# Patient Record
Sex: Female | Born: 1950
Health system: Southern US, Community
[De-identification: ages and names within clinical notes are randomized; demographics above are authoritative.]

## PROBLEM LIST (undated history)

## (undated) DIAGNOSIS — K219 Gastro-esophageal reflux disease without esophagitis: Secondary | ICD-10-CM

## (undated) DIAGNOSIS — Z860101 Personal history of adenomatous and serrated colon polyps: Secondary | ICD-10-CM

## (undated) DIAGNOSIS — M1990 Unspecified osteoarthritis, unspecified site: Secondary | ICD-10-CM

## (undated) DIAGNOSIS — Z86010 Personal history of colonic polyps: Secondary | ICD-10-CM

## (undated) DIAGNOSIS — N819 Female genital prolapse, unspecified: Secondary | ICD-10-CM

## (undated) DIAGNOSIS — R351 Nocturia: Secondary | ICD-10-CM

## (undated) DIAGNOSIS — Z8719 Personal history of other diseases of the digestive system: Secondary | ICD-10-CM

## (undated) DIAGNOSIS — D860 Sarcoidosis of lung: Secondary | ICD-10-CM

## (undated) HISTORY — PX: COMBINED MEDIASTINOSCOPY AND BRONCHOSCOPY: SUR303

## (undated) HISTORY — PX: CHOLECYSTECTOMY: SHX55

## (undated) HISTORY — PX: TONSILLECTOMY: SUR1361

---

## 1985-12-09 HISTORY — PX: VAGINAL HYSTERECTOMY: SUR661

## 1998-11-13 ENCOUNTER — Ambulatory Visit (HOSPITAL_COMMUNITY): Admission: RE | Admit: 1998-11-13 | Discharge: 1998-11-13 | Payer: Self-pay | Admitting: Cardiovascular Disease

## 2000-10-27 ENCOUNTER — Encounter: Admission: RE | Admit: 2000-10-27 | Discharge: 2000-10-27 | Payer: Self-pay | Admitting: *Deleted

## 2000-10-27 ENCOUNTER — Encounter: Payer: Self-pay | Admitting: *Deleted

## 2001-09-07 ENCOUNTER — Ambulatory Visit (HOSPITAL_COMMUNITY): Admission: RE | Admit: 2001-09-07 | Discharge: 2001-09-07 | Payer: Self-pay | Admitting: Internal Medicine

## 2001-09-07 ENCOUNTER — Encounter: Payer: Self-pay | Admitting: Internal Medicine

## 2002-09-15 ENCOUNTER — Ambulatory Visit (HOSPITAL_COMMUNITY): Admission: RE | Admit: 2002-09-15 | Discharge: 2002-09-15 | Payer: Self-pay | Admitting: Otolaryngology

## 2002-09-15 ENCOUNTER — Encounter: Payer: Self-pay | Admitting: Otolaryngology

## 2003-07-08 ENCOUNTER — Encounter: Payer: Self-pay | Admitting: *Deleted

## 2003-07-08 ENCOUNTER — Emergency Department (HOSPITAL_COMMUNITY): Admission: EM | Admit: 2003-07-08 | Discharge: 2003-07-08 | Payer: Self-pay | Admitting: *Deleted

## 2003-08-18 ENCOUNTER — Ambulatory Visit (HOSPITAL_COMMUNITY): Admission: RE | Admit: 2003-08-18 | Discharge: 2003-08-18 | Payer: Self-pay | Admitting: Cardiology

## 2003-08-31 ENCOUNTER — Ambulatory Visit (HOSPITAL_COMMUNITY): Admission: RE | Admit: 2003-08-31 | Discharge: 2003-08-31 | Payer: Self-pay | Admitting: Cardiology

## 2003-09-08 ENCOUNTER — Ambulatory Visit (HOSPITAL_COMMUNITY): Admission: RE | Admit: 2003-09-08 | Discharge: 2003-09-08 | Payer: Self-pay | Admitting: Cardiology

## 2003-11-07 ENCOUNTER — Encounter: Admission: RE | Admit: 2003-11-07 | Discharge: 2003-11-07 | Payer: Self-pay | Admitting: Internal Medicine

## 2004-07-02 ENCOUNTER — Ambulatory Visit (HOSPITAL_COMMUNITY): Admission: RE | Admit: 2004-07-02 | Discharge: 2004-07-02 | Payer: Self-pay | Admitting: Internal Medicine

## 2004-10-08 ENCOUNTER — Other Ambulatory Visit: Admission: RE | Admit: 2004-10-08 | Discharge: 2004-10-08 | Payer: Self-pay | Admitting: *Deleted

## 2004-10-09 ENCOUNTER — Ambulatory Visit (HOSPITAL_COMMUNITY): Admission: RE | Admit: 2004-10-09 | Discharge: 2004-10-09 | Payer: Self-pay | Admitting: Internal Medicine

## 2004-10-19 ENCOUNTER — Ambulatory Visit (HOSPITAL_COMMUNITY): Admission: RE | Admit: 2004-10-19 | Discharge: 2004-10-19 | Payer: Self-pay | Admitting: Thoracic Surgery

## 2004-10-19 ENCOUNTER — Encounter (INDEPENDENT_AMBULATORY_CARE_PROVIDER_SITE_OTHER): Payer: Self-pay | Admitting: Specialist

## 2004-11-09 ENCOUNTER — Ambulatory Visit (HOSPITAL_COMMUNITY): Admission: RE | Admit: 2004-11-09 | Discharge: 2004-11-09 | Payer: Self-pay | Admitting: Pulmonary Disease

## 2005-01-04 ENCOUNTER — Ambulatory Visit (HOSPITAL_COMMUNITY): Admission: RE | Admit: 2005-01-04 | Discharge: 2005-01-04 | Payer: Self-pay | Admitting: Pulmonary Disease

## 2005-02-14 ENCOUNTER — Ambulatory Visit (HOSPITAL_COMMUNITY): Admission: RE | Admit: 2005-02-14 | Discharge: 2005-02-14 | Payer: Self-pay | Admitting: Pulmonary Disease

## 2005-06-13 ENCOUNTER — Ambulatory Visit (HOSPITAL_COMMUNITY): Admission: RE | Admit: 2005-06-13 | Discharge: 2005-06-13 | Payer: Self-pay | Admitting: Pulmonary Disease

## 2005-07-24 ENCOUNTER — Ambulatory Visit: Payer: Self-pay | Admitting: Internal Medicine

## 2005-08-19 ENCOUNTER — Ambulatory Visit (HOSPITAL_COMMUNITY): Admission: RE | Admit: 2005-08-19 | Discharge: 2005-08-19 | Payer: Self-pay | Admitting: Internal Medicine

## 2005-08-19 ENCOUNTER — Ambulatory Visit: Payer: Self-pay | Admitting: Internal Medicine

## 2005-08-19 ENCOUNTER — Encounter (INDEPENDENT_AMBULATORY_CARE_PROVIDER_SITE_OTHER): Payer: Self-pay | Admitting: Internal Medicine

## 2005-08-19 HISTORY — PX: COLONOSCOPY WITH ESOPHAGOGASTRODUODENOSCOPY (EGD) AND ESOPHAGEAL DILATION (ED): SHX6495

## 2006-05-19 ENCOUNTER — Encounter: Admission: RE | Admit: 2006-05-19 | Discharge: 2006-05-19 | Payer: Self-pay | Admitting: *Deleted

## 2006-05-19 ENCOUNTER — Other Ambulatory Visit: Admission: RE | Admit: 2006-05-19 | Discharge: 2006-05-19 | Payer: Self-pay | Admitting: *Deleted

## 2006-05-28 ENCOUNTER — Ambulatory Visit: Payer: Self-pay | Admitting: Internal Medicine

## 2006-06-02 ENCOUNTER — Ambulatory Visit (HOSPITAL_COMMUNITY): Admission: RE | Admit: 2006-06-02 | Discharge: 2006-06-02 | Payer: Self-pay | Admitting: Internal Medicine

## 2006-06-23 ENCOUNTER — Ambulatory Visit (HOSPITAL_COMMUNITY): Admission: RE | Admit: 2006-06-23 | Discharge: 2006-06-23 | Payer: Self-pay | Admitting: Internal Medicine

## 2006-06-23 ENCOUNTER — Ambulatory Visit: Payer: Self-pay | Admitting: Internal Medicine

## 2006-09-03 ENCOUNTER — Ambulatory Visit: Payer: Self-pay | Admitting: Internal Medicine

## 2007-06-22 ENCOUNTER — Other Ambulatory Visit: Admission: RE | Admit: 2007-06-22 | Discharge: 2007-06-22 | Payer: Self-pay | Admitting: *Deleted

## 2007-06-22 ENCOUNTER — Encounter: Admission: RE | Admit: 2007-06-22 | Discharge: 2007-06-22 | Payer: Self-pay | Admitting: *Deleted

## 2007-08-21 ENCOUNTER — Ambulatory Visit (HOSPITAL_COMMUNITY): Admission: RE | Admit: 2007-08-21 | Discharge: 2007-08-21 | Payer: Self-pay | Admitting: Pulmonary Disease

## 2007-09-07 ENCOUNTER — Ambulatory Visit (HOSPITAL_COMMUNITY): Admission: RE | Admit: 2007-09-07 | Discharge: 2007-09-07 | Payer: Self-pay | Admitting: Pulmonary Disease

## 2007-11-23 ENCOUNTER — Ambulatory Visit (HOSPITAL_COMMUNITY): Admission: RE | Admit: 2007-11-23 | Discharge: 2007-11-23 | Payer: Self-pay | Admitting: Internal Medicine

## 2008-05-03 ENCOUNTER — Ambulatory Visit (HOSPITAL_COMMUNITY): Admission: RE | Admit: 2008-05-03 | Discharge: 2008-05-03 | Payer: Self-pay | Admitting: Pulmonary Disease

## 2008-05-09 ENCOUNTER — Ambulatory Visit (HOSPITAL_COMMUNITY): Admission: RE | Admit: 2008-05-09 | Discharge: 2008-05-09 | Payer: Self-pay | Admitting: Pulmonary Disease

## 2008-07-25 ENCOUNTER — Other Ambulatory Visit: Admission: RE | Admit: 2008-07-25 | Discharge: 2008-07-25 | Payer: Self-pay | Admitting: Gynecology

## 2008-07-25 ENCOUNTER — Encounter: Admission: RE | Admit: 2008-07-25 | Discharge: 2008-07-25 | Payer: Self-pay | Admitting: Gynecology

## 2008-08-01 ENCOUNTER — Encounter: Admission: RE | Admit: 2008-08-01 | Discharge: 2008-08-01 | Payer: Self-pay | Admitting: Gynecology

## 2008-11-23 ENCOUNTER — Ambulatory Visit (HOSPITAL_COMMUNITY): Admission: RE | Admit: 2008-11-23 | Discharge: 2008-11-23 | Payer: Self-pay | Admitting: Pulmonary Disease

## 2009-07-31 ENCOUNTER — Encounter: Admission: RE | Admit: 2009-07-31 | Discharge: 2009-07-31 | Payer: Self-pay | Admitting: Gynecology

## 2009-09-01 ENCOUNTER — Encounter: Payer: Self-pay | Admitting: *Deleted

## 2009-09-06 ENCOUNTER — Encounter: Payer: Self-pay | Admitting: Cardiology

## 2009-09-06 ENCOUNTER — Ambulatory Visit: Payer: Self-pay | Admitting: Cardiology

## 2009-09-06 DIAGNOSIS — R0789 Other chest pain: Secondary | ICD-10-CM

## 2009-09-06 DIAGNOSIS — K219 Gastro-esophageal reflux disease without esophagitis: Secondary | ICD-10-CM | POA: Insufficient documentation

## 2009-09-06 DIAGNOSIS — D869 Sarcoidosis, unspecified: Secondary | ICD-10-CM | POA: Insufficient documentation

## 2009-09-07 ENCOUNTER — Encounter: Payer: Self-pay | Admitting: Cardiology

## 2009-09-11 ENCOUNTER — Ambulatory Visit (HOSPITAL_COMMUNITY): Admission: RE | Admit: 2009-09-11 | Discharge: 2009-09-11 | Payer: Self-pay | Admitting: Cardiology

## 2009-09-11 ENCOUNTER — Ambulatory Visit: Payer: Self-pay | Admitting: Cardiology

## 2009-09-11 ENCOUNTER — Encounter: Payer: Self-pay | Admitting: Cardiology

## 2009-09-11 HISTORY — PX: TRANSTHORACIC ECHOCARDIOGRAM: SHX275

## 2009-09-12 ENCOUNTER — Encounter: Payer: Self-pay | Admitting: Cardiology

## 2009-09-13 ENCOUNTER — Ambulatory Visit: Payer: Self-pay | Admitting: Cardiology

## 2009-09-18 ENCOUNTER — Encounter: Payer: Self-pay | Admitting: Cardiology

## 2010-08-06 ENCOUNTER — Encounter: Admission: RE | Admit: 2010-08-06 | Discharge: 2010-08-06 | Payer: Self-pay | Admitting: Gynecology

## 2010-12-29 ENCOUNTER — Encounter (INDEPENDENT_AMBULATORY_CARE_PROVIDER_SITE_OTHER): Payer: Self-pay | Admitting: Internal Medicine

## 2011-04-23 NOTE — Procedures (Signed)
NAME:  [name], Jeanne Chang NO.:  [id number]   MEDICAL RECORD NO.:  [id number]          PATIENT TYPE:  OUT   LOCATION:  RESP                          FACILITY:  [facility]   PHYSICIAN:  Edward L. Juanetta Gosling, M.D.DATE OF BIRTH:  Feb 06, 1951   DATE OF PROCEDURE:  DATE OF DISCHARGE:                            PULMONARY FUNCTION TEST   1. Spirometry shows a moderate ventilatory defect without distinct      evidence of airflow obstruction.  2. Lung volumes are severely reduced.  3. DLCO is mildly reduced.  4. Arterial blood gases are normal.  5. There is no significant bronchodilator response.  6. When compared to the study of September 07, 2007, there has been      significant change in the lung volumes, and this may indicate that      the patient's sarcoidosis is more active.      Edward L. Juanetta Gosling, M.D.  Electronically Signed     ELH/MEDQ  D:  05/09/2008  T:  05/10/2008  Job:  [id number]   cc:   Madelin Rear. Sherwood Gambler, MD  Fax: [phone]   Patrica Duel, M.D.  Fax: [phone]

## 2011-04-26 NOTE — Procedures (Signed)
NAME:  Jeanne Chang, Jeanne Chang NO.:  [id number]   MEDICAL RECORD NO.:  [id number]          PATIENT TYPE:  OUT   LOCATION:  RESP                          FACILITY:  [facility]   PHYSICIAN:  Edward L. Juanetta Gosling, M.D.DATE OF BIRTH:  October 09, 1951   DATE OF PROCEDURE:  DATE OF DISCHARGE:  11/09/2004                              PULMONARY FUNCTION TEST   1.  Spirometry shows a mild to moderate ventilatory defect without deficit      airflow obstruction.  2.  There is no significant bronchodilator effect.     Ed[place]   ELH/MEDQ  D:  11/11/2004  T:  11/12/2004  Job:  [id number]   cc:   [facility]

## 2011-04-26 NOTE — Procedures (Signed)
NAME:  Jeanne Chang, LEDGER NO.:  [id number]   MEDICAL RECORD NO.:  [id number]          PATIENT TYPE:  OUT   LOCATION:  RESP                          [facility]ACILITY:  [facility]   PHYSICIAN:  Edward L. Juanetta Gosling, M.D.DATE OF BIRTH:  1951/01/16   DATE OF PROCEDURE:  DATE OF DISCHARGE:                            PULMONARY FUNCTION TEST   1. Spirometry shows a mild ventilatory defect with no evidence of      airflow obstruction.  2. Lung volumes are mildly reduced.  3. DLCO is normal.      Edward L. Juanetta Gosling, M.D.  Electronically Signed     ELH/MEDQ  D:  09/08/2007  T:  09/08/2007  Job:  [id number]   cc:   [facility]

## 2011-04-26 NOTE — Op Note (Signed)
NAME:  Jeanne Chang, Jeanne Chang             ACCOUNT NO.:  [id number]   MEDICAL RECORD NO.:  [id number]          PATIENT TYPE:  AMB   LOCATION:  DAY                           FACILITY:  [facility]   PHYSICIAN:  Lionel December, M.D.    DATE OF BIRTH:  01-Dec-1951   DATE OF PROCEDURE:  08/19/2005  DATE OF DISCHARGE:                                 OPERATIVE REPORT   PROCEDURE:  Esophagogastroduodenoscopy with esophageal dilation followed by  colonoscopy.   INDICATIONS:  Jeanne Chang is a 60 year old Caucasian female with chronic GERD  who is maintained on single or double dose PPI for chronic GERD who now  presents with solid-food dysphagia. The patient was also undergoing high-  risk screening colonoscopy. Her mother had colon carcinoma in her early 33s.  Both the procedure risks were reviewed with the patient and informed consent  was obtained.   PREOPERATIVE MEDICATIONS:  Cetacaine spray for pharyngeal topical  anesthesia, Demerol 25 mg IV, Versed 12 mg IV.   FINDINGS:  Procedure was performed in endoscopy suite. The patient's vital  signs and O2 saturation were monitored during the procedure and remained  stable.   PROCEDURE #1:  ESOPHAGOGASTRODUODENOSCOPY. The patient was placed in the  left lateral position, and Olympus videoscope was passed via oropharynx  without any difficulty into the esophagus.   Esophagus. Mucosa of the esophagus normal except there was noncritical  incomplete ring 2-cm proximal to the GE junction. GE junction was at 35 cm  and hiatus was at 37.   Stomach. It was empty and distended very well with insufflation. Folds of  proximal stomach were normal. Examination of the mucosa at body, antrum,  pylorus channel as well angularis, fundus and cardia was normal.   Duodenum. Bulbar mucosa was normal. Scope was passed to the second part of  the duodenum where mucosa and folds were normal.   Endoscope was withdrawn.   Esophagus was dilated by passing 54- and 56-French  Maloney dilators to full  insertion. Endoscope was passed again, and no mucosa disruption was noted. A  58-French Maloney dilator was also passed to full insertion, and the scope  was passed again, and there was no mucosal disruption noted. Endoscope was  withdrawn, and the patient was prepared for procedure #2.   PROCEDURE #2:  COLONOSCOPY. Rectal examination performed. No abnormality  noted on external or digital exam. Olympus videoscope was placed in rectum  and advanced under vision into sigmoid colon and beyond. Preparation was  satisfactory. A few diverticular were noted at sigmoid colon. Scope was  passed to cecum which was identified by ileocecal valve and appendiceal  orifice. Pictures taken for the record. There was a 6-mm polyp on ileocecal  valve which was snared and retrieved for histologic examination. There was  some oozing from polypectomy site which was easily controlled with type of  snare. There was a second 7 to 8 mm polyp at proximal sigmoid colon which  was snared and retrieved for histologic examination. Endoscope was passed  again, and mucosa of the sigmoid colon and rectum was carefully examined,  and there were no other abnormalities.  Scope was retroflexed to examine  anorectal junction which was unremarkable. Endoscope was straightened and  withdrawn. The patient tolerated the procedures well.   FINAL DIAGNOSES:  1.  Noncritical distal esophageal ring proximal to gastroesophageal      junction.  2.  Small sliding hiatal hernia.  3.  Esophagus was dilated by passing 54, 56, and 56-French Maloney dilators,      but no mucosa disruption induced.  4.  Normal exam reached the stomach, first and second part of the duodenum.  5.  Two polyps snared from colon, one from cecum, another one from the      sigmoid colon.  6.  Few tiny diverticula at sigmoid colon noted.   RECOMMENDATIONS:  Standard instructions given.   She will continue anti-reflux measures and Nexium  as before. I will be  contacting patient with biopsy results and further recommendations.   If she remains with dysphagia, will consider barium pill esophagogram.      Lionel December, M.D.  Electronically Signed     NR/MEDQ  D:  08/19/2005  T:  08/20/2005  Job:  [id number]   cc:   Jeanne Chang. Jeanne Gambler, MD  [address]  [place]  [place] [address]  Fax: [phone]

## 2011-04-26 NOTE — Procedures (Signed)
NAME:  Jeanne Chang, Jeanne Chang NO.:  [id number]   MEDICAL RECORD NO.:  [id number]          PATIENT TYPE:  OUT   LOCATION:  RAD                           FACILITY:  [facility]   PHYSICIAN:  Edward L. Juanetta Gosling, M.D.DATE OF BIRTH:  Jul 24, 1951   DATE OF PROCEDURE:  DATE OF DISCHARGE:                            PULMONARY FUNCTION TEST   1. Spirometry shows a mild ventilatory defect with what appears to be      mostly restrictive pattern, but there is some airflow obstruction      at the level of the smaller airways.  This may be artifactual based      on the restrictive pattern.  2. Lung volumes show mild reduction in total lung capacity, which is      about the same magnitude as the ventilatory defect.  3. DLCO is mildly diminished at about the same order of magnitude as      well.  4. There is no significant bronchodilator effect.      Edward L. Juanetta Gosling, M.D.  Electronically Signed     ELH/MEDQ  D:  11/24/2008  T:  11/24/2008  Job:  [id number]

## 2011-04-26 NOTE — Op Note (Signed)
NAME:  Jeanne Chang, Jeanne Chang NO.:  [id number]   MEDICAL RECORD NO.:  [id number]          PATIENT TYPE:  OIB   LOCATION:  2899                         FACILITY:  [facility]   PHYSICIAN:  Ines Bloomer, M.D. DATE OF BIRTH:  [birth year]   DATE OF PROCEDURE:  10/19/2004  DATE OF DISCHARGE:  10/19/2004                                 OPERATIVE REPORT   PREOPERATIVE DIAGNOSIS:  Mediastinal hilar adenopathy, questionable right  middle lobe lesion.   POSTOPERATIVE DIAGNOSIS:  Mediastinal hilar adenopathy, questionable right  middle lobe lesion.   OPERATION PERFORMED:  Fiberoptic bronchoscopy and mediastinoscopy.   SURGEON:  Ines Bloomer, M.D.   ANESTHESIA:  General.   DESCRIPTION OF PROCEDURE:  After general anesthesia the fiberoptic  bronchoscopy was passed through the endotracheal tube, the carina was in the  midline.  The left mainstem, left upper lobe and left lower lobe orifices  were normal.  The right main stem, right upper lobe orifices were normal.  The bronchus intermedius there was some questionable cobblestoning distally  going into the right middle lobe and there was an area of kind of  cobblestoning in the right middle lobe orifice.  Biopsies were taken of this  abnormal area in the right middle lobe and brushings also.  The fiberoptic  bronchoscope was removed.  Pictures were taken also.  The anterior neck was  prepped and draped in the usual sterile manner.  A transverse incision was  made and carried down with electrocautery through the subcutaneous tissue  and fascia.  The pretracheal fascia.  Biopsies of 2R node were done.  Strap  muscles were closed with 2-0 Vicryl, subcutaneous tissue with 3-0 Vicryl and  Dermabonde for the skin.  The patient was then transferred to the recovery  room in stable condition.       DPB/MEDQ  D:  10/19/2004  T:  10/19/2004  Job:  [id number]

## 2011-04-26 NOTE — Procedures (Signed)
NAME:  Jeanne Chang, Jeanne Chang                       ACCOUNT NO.:  [id number]   MEDICAL RECORD NO.:  [id number]                   PATIENT TYPE:  OUT   LOCATION:  RAD                                  FACILITY:  [facility]   PHYSICIAN:  [place] Bing, M.D.               DATE OF BIRTH:  16-Jul-1951   DATE OF PROCEDURE:  DATE OF DISCHARGE:  08/18/2003                                  ECHOCARDIOGRAM   PROCEDURE:  Stress echocardiogram.   REFERRING PHYSICIANS:  Madelin Rear. Sherwood Gambler, M.D. and [place] Bing, M.D.   CLINICAL DATA:  A 60 year old woman with chest pain and an abnormal standard  treadmill stress test   CLINICAL DATA:  1. Baseline echocardiographic images were technically suboptimal.  Left     ventricular size was at the upper limit of normal. The right ventricle     was normal in size and function.  Right atrial size was normal.  The     mitral and aortic valves appeared normal.  Tricuspid valve was not     adequately imaged.  Poor endocardial definition of the left ventricle was     noted.  No definite segmental wall motion abnormalities apparent.  Left     ventricular size was normal.  2. Upright treadmill exercise was performed to a workload of 7 METS and a     heart rate of 159, 95% of age-predicted maximum.  Exercise was     discontinued due to leg fatigue and dyspnea.  The patient also reported     mild-to-moderate chest heaviness.  Blood pressure increased from a     resting value of 140/80 to 190/90 at peak exercise, a normal response.  3. Baseline EKG:  Sinus bradycardia; within normal limits.  4. Stress EKG: Towards the end of the protocol, the patient developed 1-2.5     mm of flat and slowly downsloping ST segment depression.  Interpretation     was impaired by baseline drift and muscle artifact.  In stage 2 of the     protocol, significant flat and slowly downsloping ST segment depression     developed in the inferior leads as well as V6.  This reached to maximum     of  2-to-3 mm in these leads.  By the end of the first minute of recovery,     these changes had largely reversed.  5. No important arrhythmias -- PVCs and occasional paired PVCs noted.  6. Stress images were technically suboptimal.  No abnormalities detected.     An attempt will be made to repeat this study using intravenous contrast.   IMPRESSION:  A technically inadequate stress echocardiogram reproducing the  patient's stress-induced EKG abnormalities and symptoms.  No definite  ischemic changes noted on suboptimal echocardiographic images.  [place] Bing, M.D.    RR/MEDQ  D:  08/20/2003  T:  08/20/2003  Job:  [id number]

## 2011-04-26 NOTE — Procedures (Signed)
   NAME:  Jeanne Chang, Jeanne Chang                       ACCOUNT NO.:  [id number]   MEDICAL RECORD NO.:  [id number]                   PATIENT TYPE:  OUT   LOCATION:  RAD                                  FACILITY:  [facility]   PHYSICIAN:  [place] Bing, M.D.               DATE OF BIRTH:  October 22, 1951   DATE OF PROCEDURE:  08/18/2003  DATE OF DISCHARGE:                                  ECHOCARDIOGRAM   CLINICAL DATA:  A 60 year old woman with chest pain and a murmur.   M-MODE:  Aorta 2.0. Left atrium 4.8. Septum 1.2. Posterior wall 1.2. LV  diastole 4.4. LV systole 3.0.   IMPRESSION:  1. Technically adequate echocardiographic study.  2. Mild left and right atrial enlargement. Right ventricular size is     prominent; no right ventricular hypertrophy; normal right ventricular     systolic function.  3. Normal aortic valve.  4. Normal mitral valve; very mild mitral regurgitation.  5. Normal tricuspid valve with trivial regurgitation.  6. Normal pulmonic valve.  7. Normal internal dimension of the left ventricle; very mild left     ventricular hypertrophy. Normal regional and global left ventricular     systolic function.  8. Normal inferior vena cava.                                                Bing, M.D.    RR/MEDQ  D:  08/19/2003  T:  08/19/2003  Job:  [id number]

## 2011-04-26 NOTE — Procedures (Signed)
   NAME:  Jeanne Chang, Jeanne Chang NO.:  [id number]   MEDICAL RECORD NO.:  [id number]                    PATIENT TYPE:  OUT   LOCATION:                                       FACILITY:   PHYSICIAN:  [place] Bing, M.D.               DATE OF BIRTH:  09/08/2003   DATE OF PROCEDURE:  DATE OF DISCHARGE:                                  ECHOCARDIOGRAM   STRESS ECHOCARDIOGRAM:   REFERRING PHYSICIANS:  Madelin Rear. Sherwood Gambler, M.D. and [place] Bing, M.D.   CLINICAL DATA:  A 59 year old woman with chest pain and an abnormal standard  treadmill stress test.   FINDINGS:  1. An upright treadmill exercise performed to a workload of 10 METS and a     heart rate of 152, 90% of age-predicted maximum.  Exercise discontinued     due to fatigue and dyspnea.  No chest pain reported.  2. Blood pressure increased from a resting value of 120/75 to 190/80 at peak     exercise, a normal response.  3. No significant arrhythmias noted.  4. Baseline EKG: Sinus bradycardia; within normal limits.  5. Stress EKG: Flat and downsloping ST segment depression of 1-1.5 mm     developed towards the end of exercise.  These were present in the     inferior leads as well as V5 and V6.  There was rapid reversion towards     normal within the first minute of recovery.  6. Baseline echocardiogram: Normal chamber dimensions; normal aortic, mitral     and tricuspid valves. Normal left ventricular size and function.  7. Post-stress echocardiogram.  Imaging performed in conduction with an     intravenous echocardiographic contrast injection.  There was normal to     hyperdynamic function in all myocardial segments.  Imaging was somewhat     suboptimal and off axis with some shadowing, by contrast in the apical     images.  There was a substantial delay to the recording of parasternal     images.  As a result of these technical issues the test would be     considered negative, but suboptimal.      ___________________________________________                                            [place] Bing, M.D.   RR/MEDQ  D:  09/08/2003  T:  09/09/2003  Job:  [id number]

## 2011-04-26 NOTE — Op Note (Signed)
NAME:  Jeanne Chang, Jeanne Chang             ACCOUNT NO.:  [id number]   MEDICAL RECORD NO.:  [id number]          PATIENT TYPE:  AMB   LOCATION:  DAY                           [facility]ILITY:  [facility]   PHYSICIAN:  Lionel December, M.D.    DATE OF BIRTH:  1951/01/20   DATE OF PROCEDURE:  06/23/2006  DATE OF DISCHARGE:                                 OPERATIVE REPORT   PROCEDURE:  Esophagogastroduodenoscopy with esophageal dilation.   INDICATIONS:  [name] is a 60 year old African-American female who has  chronic GERD and maintained on PPI who complains of solid food dysphagia.  Her last EGD with ED was in September 2006 and she noted complete relief of  her dysphagia for at least 6-8 months.  She had a barium study recently.  She had a small sliding hiatal hernia but no ring or stricture noted.  The  barium pill passed without any delay.   The patient is undergoing repeat EGD with ED since she responded so well to  it last year.  I have discussed the procedure with the patient and told her  we may dilate her esophagus at least 58-French maybe 60 this time.  Last  time, it  was dilated to 58-French.   Procedure and risks were reviewed the patient and informed consent was  obtained.   Medications for conscious sedation, benzocaine spray for pharyngeal topical  anesthesia, Demerol 50 mg intravenously,  Versed 10 mg intravenously in  divided dose.   FINDINGS:  Procedure performed in endoscopy suite.  The patient's vital  signs and O2 saturation were monitored during the procedure and remained  stable.  The patient was placed in left lateral position and Olympus  videoscope was passed through the oropharynx without any difficulty into the  esophagus.   Esophagus:  Mucosa of the esophagus was normal throughout.  GE junction was  at 30 cm from the incisors and was unremarkable.  Hiatus was at 34.   Stomach was empty and distended very well to insufflation.  Folds of  proximal stomach were normal.   Examination of the mucosa at body, antrum,  pyloric channel, as well as angularis, fundus, and cardia was normal.   Duodenum:  Bulbar mucosa was normal.  The scope was passed to the second  part of duodenum.  The mucosa and folds were normal.   The endoscope was withdrawn.   The esophagus was dilated by passing 58 and 60-French Maloney dilators to  full insertion.  As the dilator was withdrawn, the endoscope was passed  again and there was a small tear at the cervical esophagus, possibly  indicative of a web.  Pictures taken for the record.  The endoscope was  withdrawn.  The patient tolerated the procedure well.   FINAL DIAGNOSIS:  Small sliding hiatal hernia, otherwise normal  esophagogastroduodenoscopy.   Esophageal dilation with 58 and 60-French Maloney dilator resulted in a  small mucosal disruption at cervical esophagus, indicative of a web.   RECOMMENDATIONS:  She will resume her usual medications as before.  Next,  she will call the office with progress report in one week.  Lionel December, M.D.  Electronically Signed     NR/MEDQ  D:  06/23/2006  T:  06/23/2006  Job:  [id number]   cc:   Madelin Rear. Sherwood Gambler, MD  Fax: [phone]

## 2011-04-26 NOTE — Consult Note (Signed)
NAME:  Jeanne Chang, Jeanne Chang             ACCOUNT NO.:  [id number]   MEDICAL RECORD NO.:  [id number]          PATIENT TYPE:  AMB   LOCATION:                                F[facility]   PHYSICIAN:  Lionel December, M.D.    DATE OF BIRTH:  May 17, 1951   DATE OF CONSULTATION:  07/24/2005  DATE OF DISCHARGE:                                   CONSULTATION   CHIEF COMPLAINT:  Acid reflux, difficulty swallowing, chest pain.   REQUESTING PHYSICIAN:  Dr. Juanetta Gosling.   PRIMARY CARE PHYSICIAN:  Dr. Sherwood Gambler.   HISTORY OF PRESENT ILLNESS:  Jeanne Chang is a 60 year old black female,  patient of Dr. Juanetta Gosling, who presents today for further evaluation of above  stated symptoms. She states that she has had acid reflux disease for over  five years. She has been on PPI therapy chronically. Recently, she started  having more difficulties with chest pressure. She has also noticed  difficulty swallowing liquids and solids. Denies any typical heartburn  symptoms. She has increased her Nexium to 40 mg b.i.d. recently. She has had  a significant weight gain of 40 pounds since starting prednisone last fall  when she was diagnosed with sarcoidosis. She really has not had much in the  way of shortness of breath recently but has been inclined to have to prop  herself up with multiple pillows at night time. She has chronic constipation  which she takes p.r.n. milk of magnesia. Denies any prior colonoscopy. No  melena or rectal bleeding.   FAMILY HISTORY:  Her mother had colon cancer diagnosed in her early 33s but  subsequently died of a MI at age 56.   CURRENT MEDICATIONS:  1.  Hydrocodone 5/500 mg p.r.n.  2.  Xanax 0.5 mg p.r.n.  3.  Nexium 40 mg b.i.d.  4.  Prednisone 40 mg daily.   ALLERGIES:  No known drug allergies.   PAST MEDICAL HISTORY:  1.  Anxiety.  2.  Chronic gastroesophageal reflux disease.  3.  Chronic constipation.  4.  Allergies.  5.  Sarcoidosis diagnosed November 2005. Has been on prednisone  since then.  6.  Status post cholecystectomy and tonsillectomy.  7.  She reports having three negative stress tests the last few years.   FAMILY HISTORY:  Mother died at age 62 of a MI but had colon cancer  diagnosed in her early 48s.   SOCIAL HISTORY:  She is married. She had one biological daughter who died in  a MVA at age 62. She has an adopted son. She is a Scientist, research (medical) at [employer]. She has never been a smoker. She denies any alcohol use.   REVIEW OF SYSTEMS:  See HPI for GI and constitution and cardiopulmonary.   PHYSICAL EXAMINATION:  VITAL SIGNS:  Weight 202, height 5 foot 3 inches,  temperature 97.9, blood pressure 138/74, pulse 72.  GENERAL:  Pleasant, moderately obese, black female in no acute distress.  SKIN:  Warm and dry. No jaundice.  HEENT:  Pupils are equal, round, and reactive to light. Conjunctivae are  pink. Sclerae are nonicteric. Oropharyngeal mucosa  moist and pink. No  lesions, erythema, or exudate. No lymphadenopathy or thyromegaly.  CHEST:  Lungs are clear to auscultation.  CARDIAC:  Reveals regular rate and rhythm. Normal S1 and S2. No murmurs,  rubs, or gallops.  ABDOMEN:  Positive bowel sounds, obese but symmetrical, soft. Nontender. No  organomegaly or masses. No rebound tenderness or guarding. No abdominal  bruits or hernias.  EXTREMITIES:  Trace pedal edema bilaterally.   IMPRESSION:  Jeanne Chang is a 60 year old lady with a history of chronic  gastroesophageal reflux disease maintained on Nexium 40 mg daily who  recently has developed worsening of chest pressure and dysphagia to solids  and liquids. She is now on Nexium 40 mg b.i.d. but continues to have  symptoms. She has been on prednisone chronically. She has had a significant  weight gain which I suspect is playing a role in poorly controlled symptoms.  Reassuring is that she has had multiple negative stress tests. She recently  had a chest CT, and she has stable mild hilar lymphadenopathy,  multiple tiny  bilateral pulmonary nodules which were all stable. At this point, she ought  to have an upper endoscopy for diagnostic purposes. She may have erosive  esophagitis or even esophageal stricture. Will also look for Barrett's  esophagus given history of chronic GERD. She complains of chronic  constipation, has a family history of colorectal cancer, has never had high  risk screening. She desires proceeding with a colonoscopy at this time as  well.   PLAN:  1.  EGD and colonoscopy in the near future. I have discussed risks,      alternatives, and benefits with the patient with      regards to but not limited risk of reaction to medications, bleeding,      infection, and perforation. The patient is agreeable to proceed.  2.  She will continue Nexium 40 mg b.i.d. for now.      Tana Coast, P.A.      Lionel December, M.D.  Electronically Signed    LL/MEDQ  D:  07/24/2005  T:  07/24/2005  Job:  [id number]   cc:   Ramon Dredge L. Juanetta Gosling, M.D.  [address]  [place]  [place] [address]  Fax: [phone]

## 2011-07-22 ENCOUNTER — Telehealth (INDEPENDENT_AMBULATORY_CARE_PROVIDER_SITE_OTHER): Payer: Self-pay | Admitting: *Deleted

## 2011-07-22 DIAGNOSIS — Z800 Family history of malignant neoplasm of digestive organs: Secondary | ICD-10-CM

## 2011-07-22 DIAGNOSIS — Z86010 Personal history of colonic polyps: Secondary | ICD-10-CM

## 2011-07-22 MED ORDER — PEG-KCL-NACL-NASULF-NA ASC-C 100 G PO SOLR: 1.0000 | Freq: Once | ORAL | Status: DC

## 2011-07-22 NOTE — Telephone Encounter (Signed)
TCS sch'd 11/13/11 @ 8:30 (7:30), movi prep instruction given

## 2011-07-31 ENCOUNTER — Other Ambulatory Visit: Payer: Self-pay | Admitting: Gynecology

## 2011-07-31 DIAGNOSIS — Z1231 Encounter for screening mammogram for malignant neoplasm of breast: Secondary | ICD-10-CM

## 2011-08-19 ENCOUNTER — Ambulatory Visit
Admission: RE | Admit: 2011-08-19 | Discharge: 2011-08-19 | Disposition: A | Discharge status: Home / Self Care | Payer: 59 | Source: Ambulatory Visit | Attending: Gynecology | Admitting: Gynecology

## 2011-08-19 DIAGNOSIS — Z1231 Encounter for screening mammogram for malignant neoplasm of breast: Secondary | ICD-10-CM

## 2011-09-05 LAB — BLOOD GAS, ARTERIAL
Acid-Base Excess: 0.7
Bicarbonate: 25.1 — ABNORMAL HIGH
O2 Saturation: 94.4
TCO2: 22.4
pCO2 arterial: 43.1
pO2, Arterial: 80.1

## 2011-10-01 ENCOUNTER — Other Ambulatory Visit (HOSPITAL_COMMUNITY): Payer: Self-pay | Admitting: Internal Medicine

## 2011-10-01 DIAGNOSIS — IMO0002 Reserved for concepts with insufficient information to code with codable children: Secondary | ICD-10-CM

## 2011-10-04 ENCOUNTER — Ambulatory Visit (HOSPITAL_COMMUNITY): Payer: 59

## 2011-10-07 ENCOUNTER — Ambulatory Visit (HOSPITAL_COMMUNITY)
Admission: RE | Admit: 2011-10-07 | Discharge: 2011-10-07 | Disposition: A | Discharge status: Home / Self Care | Payer: 59 | Source: Ambulatory Visit | Attending: Internal Medicine | Admitting: Internal Medicine

## 2011-10-07 DIAGNOSIS — IMO0002 Reserved for concepts with insufficient information to code with codable children: Secondary | ICD-10-CM

## 2011-10-07 DIAGNOSIS — M545 Low back pain, unspecified: Secondary | ICD-10-CM | POA: Insufficient documentation

## 2011-10-07 DIAGNOSIS — M51379 Other intervertebral disc degeneration, lumbosacral region without mention of lumbar back pain or lower extremity pain: Secondary | ICD-10-CM | POA: Insufficient documentation

## 2011-10-07 DIAGNOSIS — M5126 Other intervertebral disc displacement, lumbar region: Secondary | ICD-10-CM | POA: Insufficient documentation

## 2011-10-07 DIAGNOSIS — M5137 Other intervertebral disc degeneration, lumbosacral region: Secondary | ICD-10-CM | POA: Insufficient documentation

## 2011-10-15 ENCOUNTER — Other Ambulatory Visit (INDEPENDENT_AMBULATORY_CARE_PROVIDER_SITE_OTHER): Payer: Self-pay | Admitting: *Deleted

## 2011-10-15 DIAGNOSIS — Z800 Family history of malignant neoplasm of digestive organs: Secondary | ICD-10-CM

## 2011-10-15 DIAGNOSIS — Z86010 Personal history of colonic polyps: Secondary | ICD-10-CM

## 2011-11-06 ENCOUNTER — Telehealth (INDEPENDENT_AMBULATORY_CARE_PROVIDER_SITE_OTHER): Payer: Self-pay | Admitting: *Deleted

## 2011-11-06 NOTE — Telephone Encounter (Signed)
Agree with colonoscopy.

## 2011-11-06 NOTE — Telephone Encounter (Signed)
PCP/Requesting MD:  Sherwood Gambler  Name: LYLIA KARN  DOB: 04/22/1951  Home Phone: [phone]      Procedure: TCS  Reason/Indication:  SURVEILLANCE, HX POLYPS, + FH CRC  Has patient had this procedure before?  YES  If so, when, by whom and where?  2006  Is there a family history of colon cancer?  YES  Who?  What age when diagnosed?  MOTHER  Is patient diabetic?   NO      Does patient have prosthetic heart valve?  NO  Do you have a pacemaker?  NO  Has patient had joint replacement within last 12 months?  NO  Is patient on Coumadin, Plavix and/or Aspirin? NO   Medications: FLOVENT 110 MEQ BID, NEXIUM 40 MG DAILY, ALPRAZOLAM 0.5 MG DAILY  Allergies: TETANUS  Pharmacy: [facility]  Medication Adjustment: NONE  Procedure date & time: 11/13/11 @ 8:30

## 2011-11-12 MED ORDER — SODIUM CHLORIDE 0.45 % IV SOLN
Freq: Once | INTRAVENOUS | Status: AC
  Administered 2011-11-13: 08:00:00 via INTRAVENOUS

## 2011-11-13 ENCOUNTER — Encounter (HOSPITAL_COMMUNITY): Payer: Self-pay

## 2011-11-13 ENCOUNTER — Other Ambulatory Visit (INDEPENDENT_AMBULATORY_CARE_PROVIDER_SITE_OTHER): Payer: Self-pay | Admitting: Internal Medicine

## 2011-11-13 ENCOUNTER — Encounter (HOSPITAL_COMMUNITY)
Admission: RE | Disposition: A | Discharge status: Home / Self Care | Payer: Self-pay | Source: Ambulatory Visit | Attending: Internal Medicine

## 2011-11-13 ENCOUNTER — Ambulatory Visit (HOSPITAL_COMMUNITY)
Admission: RE | Admit: 2011-11-13 | Discharge: 2011-11-13 | Disposition: A | Discharge status: Home / Self Care | Payer: 59 | Source: Ambulatory Visit | Attending: Internal Medicine | Admitting: Internal Medicine

## 2011-11-13 DIAGNOSIS — Z09 Encounter for follow-up examination after completed treatment for conditions other than malignant neoplasm: Secondary | ICD-10-CM

## 2011-11-13 DIAGNOSIS — D126 Benign neoplasm of colon, unspecified: Secondary | ICD-10-CM

## 2011-11-13 DIAGNOSIS — Z860100 Personal history of colon polyps, unspecified: Secondary | ICD-10-CM | POA: Insufficient documentation

## 2011-11-13 DIAGNOSIS — Z800 Family history of malignant neoplasm of digestive organs: Secondary | ICD-10-CM | POA: Insufficient documentation

## 2011-11-13 DIAGNOSIS — K5730 Diverticulosis of large intestine without perforation or abscess without bleeding: Secondary | ICD-10-CM | POA: Insufficient documentation

## 2011-11-13 HISTORY — PX: COLONOSCOPY: SHX5424

## 2011-11-13 HISTORY — DX: Unspecified osteoarthritis, unspecified site: M19.90

## 2011-11-13 SURGERY — COLONOSCOPY
Anesthesia: Moderate Sedation

## 2011-11-13 MED ORDER — MIDAZOLAM HCL 5 MG/5ML IJ SOLN
INTRAMUSCULAR | Status: AC
  Filled 2011-11-13: qty 10

## 2011-11-13 MED ORDER — STERILE WATER FOR IRRIGATION IR SOLN
Status: DC | PRN
  Administered 2011-11-13: 09:00:00

## 2011-11-13 MED ORDER — MEPERIDINE HCL 50 MG/ML IJ SOLN
INTRAMUSCULAR | Status: AC
  Filled 2011-11-13: qty 1

## 2011-11-13 MED ORDER — MIDAZOLAM HCL 5 MG/5ML IJ SOLN
INTRAMUSCULAR | Status: DC | PRN
  Administered 2011-11-13: 2 mg via INTRAVENOUS
  Administered 2011-11-13: 1 mg via INTRAVENOUS
  Administered 2011-11-13: 2 mg via INTRAVENOUS

## 2011-11-13 MED ORDER — MEPERIDINE HCL 50 MG/ML IJ SOLN
INTRAMUSCULAR | Status: DC | PRN
  Administered 2011-11-13 (×2): 25 mg via INTRAVENOUS

## 2011-11-13 NOTE — H&P (Signed)
Jeanne Chang is an 60 y.o. female.   Chief Complaint: Patient is here for colonoscopy. HPI: Patient is 60 year old female who has history of colonic adenomas and family history of colon carcinoma. Her last exam was over 6 years ago with removal of 2 polyps which were adenomas. She denies abdominal pain, change in her bowel habits or rectal bleeding.. Patient's mother had colon carcinoma in her 34s and died at 33 of unrelated causes.  Past Medical History  Diagnosis Date  . Shortness of breath     sarcoidosis  . Arthritis   . Anemia     Past Surgical History  Procedure Date  . Tonsillectomy     60 years old  . Abdominal hysterectomy     60 years old  . Cholecystectomy     59 years old    History reviewed. No pertinent family history. Social History:  reports that she has never smoked. She does not have any smokeless tobacco history on file. She reports that she does not drink alcohol or use illicit drugs.  Allergies: No Known Allergies  Medications Prior to Admission  Medication Dose Route Frequency Provider Last Rate Last Dose  . 0.45 % sodium chloride infusion   Intravenous Once Malissa Hippo, MD 20 mL/hr at 11/13/11 0824    . meperidine (DEMEROL) 50 MG/ML injection           . midazolam (VERSED) 5 MG/5ML injection            Medications Prior to Admission  Medication Sig Dispense Refill  . esomeprazole (NEXIUM) 40 MG capsule Take 40 mg by mouth daily before breakfast.        . fluticasone (FLOVENT HFA) 110 MCG/ACT inhaler Inhale 1 puff into the lungs 2 (two) times daily.          No results found for this or any previous visit (from the past 48 hour(s)). No results found.  Review of Systems  Constitutional: Negative for weight loss.  Gastrointestinal: Negative for abdominal pain, diarrhea, constipation, blood in stool and melena.    Blood pressure 172/65, pulse 46, temperature 98.2 F (36.8 C), temperature source Oral, resp. rate 18, height 5\' 2"  (1.575 m),  weight 197 lb (89.359 kg), SpO2 98.00%. Physical Exam  Constitutional: She appears well-developed and well-nourished.  HENT:  Mouth/Throat: Oropharynx is clear and moist.  Eyes: Conjunctivae are normal. No scleral icterus.  Neck: No thyromegaly present.  Cardiovascular: Normal rate, regular rhythm and normal heart sounds.   No murmur heard. Musculoskeletal: She exhibits no edema.  Lymphadenopathy:    She has no cervical adenopathy.  Skin: Skin is warm and dry.     Assessment/Plan History of colonic adenomas. Family history of colon carcinoma and a first degree relative. Surveillance colonoscopy.  Jeanne Chang U 11/13/2011, 8:31 AM

## 2011-11-13 NOTE — Op Note (Signed)
COLONOSCOPY PROCEDURE REPORT  PATIENT:  Jeanne Chang  MR#:  [id number] Birthdate:  Jan 06, 1951, 60 y.o., female Endoscopist:  Dr. Malissa Hippo, MD Referred By:  Dr. Madelin Rear. Sherwood Gambler, MD Procedure Date: 11/13/2011  Procedure:   Colonoscopy  Indications:  Patient is 60 year old female with history of colonic adenomas and family history of colon carcinoma. He had 2 adenomas removed her last exam over 5 years ago. her mother had colon carcinoma diagnosed in her late 45s.  Informed Consent: Procedure  and risks were reviewed with the patient and informed consent was obtained. Medications:  Demerol 50 mg IV Versed 5 mg IV Atropine 0.5 mg IV  Description of procedure:  After a digital rectal exam was performed, that colonoscope was advanced from the anus through the rectum and colon to the area of the cecum, ileocecal valve and appendiceal orifice. The cecum was deeply intubated. These structures were well-seen and photographed for the record. From the level of the cecum and ileocecal valve, the scope was slowly and cautiously withdrawn. The mucosal surfaces were carefully surveyed utilizing scope tip to flexion to facilitate fold flattening as needed. The scope was pulled down into the rectum where a thorough exam including retroflexion was performed.  Findings:   Patient was noted to have resting bradycardia with heart rate around 44. She picked up with the exercise. She was given 0.5 mg of atropine IV. Prep satisfactory. Small diverticulum at cecum along with another one at  ascending colon and few more at sigmoid colon. Small polyp ablated via cold biopsy from proximal sigmoid colon. Normal rectal mucosa and anorectal junction.   Therapeutic/Diagnostic Maneuvers Performed:  See above  Complications:  None  Cecal Withdrawal Time:  13 minutes  Impression:  Examination performed to cecum. Few diverticula at sigmoid colon along with one at cecum and ascending colon. Small polyp  ablated via cold biopsy from sigmoid colon.  Recommendations:  Standard instructions given. I will be contacting patient with biopsy results. She should return for next exam in 5 years.  Jeanne Chang U  11/13/2011 9:12 AM  CC: Dr. Cassell Smiles., MD & Dr. Bonnetta Barry ref. provider found

## 2011-11-13 NOTE — OR Nursing (Addendum)
Pt's heartrate 42-44 bpm prior to sedation; Dr Karilyn Cota made aware; with leg lifts pt's heart rate up to 70. Will continue to monitor. Pt's heartrate during procedure in mid 50's throughout.  Post procedure in upper 75s'

## 2011-11-19 ENCOUNTER — Encounter (INDEPENDENT_AMBULATORY_CARE_PROVIDER_SITE_OTHER): Payer: Self-pay | Admitting: *Deleted

## 2011-11-26 ENCOUNTER — Encounter (HOSPITAL_COMMUNITY): Payer: Self-pay | Admitting: Internal Medicine

## 2012-07-14 ENCOUNTER — Other Ambulatory Visit: Payer: Self-pay | Admitting: Gynecology

## 2012-07-14 DIAGNOSIS — Z1231 Encounter for screening mammogram for malignant neoplasm of breast: Secondary | ICD-10-CM

## 2012-08-24 ENCOUNTER — Ambulatory Visit
Admission: RE | Admit: 2012-08-24 | Discharge: 2012-08-24 | Disposition: A | Discharge status: Home / Self Care | Payer: 59 | Source: Ambulatory Visit | Attending: Gynecology | Admitting: Gynecology

## 2012-08-24 DIAGNOSIS — Z1231 Encounter for screening mammogram for malignant neoplasm of breast: Secondary | ICD-10-CM

## 2013-08-04 ENCOUNTER — Other Ambulatory Visit: Payer: Self-pay

## 2013-08-04 DIAGNOSIS — Z1231 Encounter for screening mammogram for malignant neoplasm of breast: Secondary | ICD-10-CM

## 2013-08-30 ENCOUNTER — Ambulatory Visit: Payer: 59

## 2013-09-06 ENCOUNTER — Ambulatory Visit
Admission: RE | Admit: 2013-09-06 | Discharge: 2013-09-06 | Disposition: A | Discharge status: Home / Self Care | Payer: 59 | Source: Ambulatory Visit

## 2013-09-06 DIAGNOSIS — Z1231 Encounter for screening mammogram for malignant neoplasm of breast: Secondary | ICD-10-CM

## 2014-08-29 ENCOUNTER — Other Ambulatory Visit: Payer: Self-pay

## 2014-08-29 DIAGNOSIS — Z1231 Encounter for screening mammogram for malignant neoplasm of breast: Secondary | ICD-10-CM

## 2014-09-12 ENCOUNTER — Ambulatory Visit
Admission: RE | Admit: 2014-09-12 | Discharge: 2014-09-12 | Disposition: A | Discharge status: Home / Self Care | Payer: 59 | Source: Ambulatory Visit

## 2014-09-12 DIAGNOSIS — Z1231 Encounter for screening mammogram for malignant neoplasm of breast: Secondary | ICD-10-CM

## 2014-09-21 ENCOUNTER — Other Ambulatory Visit: Payer: Self-pay | Admitting: Gynecology

## 2014-09-22 LAB — CYTOLOGY - PAP

## 2014-12-21 ENCOUNTER — Other Ambulatory Visit: Payer: Self-pay | Admitting: Urology

## 2015-01-09 ENCOUNTER — Encounter (HOSPITAL_BASED_OUTPATIENT_CLINIC_OR_DEPARTMENT_OTHER): Payer: Self-pay | Admitting: *Deleted

## 2015-01-11 ENCOUNTER — Encounter (HOSPITAL_BASED_OUTPATIENT_CLINIC_OR_DEPARTMENT_OTHER): Payer: Self-pay | Admitting: *Deleted

## 2015-01-11 ENCOUNTER — Other Ambulatory Visit: Payer: Self-pay | Admitting: Urology

## 2015-01-11 NOTE — Progress Notes (Signed)
NPO AFTER MN. ARRIVE AT 0715. NEEDS HG. WILL DO MAGNESIUM CITRATE DAY BEFORE DOS. REVIEWED [facility] GUIDELINES.

## 2015-01-16 ENCOUNTER — Ambulatory Visit (HOSPITAL_BASED_OUTPATIENT_CLINIC_OR_DEPARTMENT_OTHER): Payer: 59 | Admitting: Anesthesiology

## 2015-01-16 ENCOUNTER — Ambulatory Visit (HOSPITAL_BASED_OUTPATIENT_CLINIC_OR_DEPARTMENT_OTHER)
Admission: RE | Admit: 2015-01-16 | Discharge: 2015-01-17 | Disposition: A | Discharge status: Home / Self Care | Payer: 59 | Source: Ambulatory Visit | Attending: Urology | Admitting: Urology

## 2015-01-16 ENCOUNTER — Encounter (HOSPITAL_BASED_OUTPATIENT_CLINIC_OR_DEPARTMENT_OTHER): Payer: Self-pay | Admitting: *Deleted

## 2015-01-16 ENCOUNTER — Encounter (HOSPITAL_BASED_OUTPATIENT_CLINIC_OR_DEPARTMENT_OTHER)
Admission: RE | Disposition: A | Discharge status: Home / Self Care | Payer: Self-pay | Source: Ambulatory Visit | Attending: Urology

## 2015-01-16 DIAGNOSIS — Z9049 Acquired absence of other specified parts of digestive tract: Secondary | ICD-10-CM | POA: Diagnosis not present

## 2015-01-16 DIAGNOSIS — N952 Postmenopausal atrophic vaginitis: Secondary | ICD-10-CM | POA: Insufficient documentation

## 2015-01-16 DIAGNOSIS — N8111 Cystocele, midline: Secondary | ICD-10-CM | POA: Diagnosis not present

## 2015-01-16 DIAGNOSIS — K219 Gastro-esophageal reflux disease without esophagitis: Secondary | ICD-10-CM | POA: Insufficient documentation

## 2015-01-16 DIAGNOSIS — D869 Sarcoidosis, unspecified: Secondary | ICD-10-CM | POA: Insufficient documentation

## 2015-01-16 DIAGNOSIS — M1990 Unspecified osteoarthritis, unspecified site: Secondary | ICD-10-CM | POA: Insufficient documentation

## 2015-01-16 DIAGNOSIS — N3289 Other specified disorders of bladder: Secondary | ICD-10-CM | POA: Diagnosis not present

## 2015-01-16 DIAGNOSIS — Z79899 Other long term (current) drug therapy: Secondary | ICD-10-CM | POA: Diagnosis not present

## 2015-01-16 DIAGNOSIS — Z90710 Acquired absence of both cervix and uterus: Secondary | ICD-10-CM | POA: Insufficient documentation

## 2015-01-16 DIAGNOSIS — N819 Female genital prolapse, unspecified: Secondary | ICD-10-CM | POA: Diagnosis present

## 2015-01-16 DIAGNOSIS — N8110 Cystocele, unspecified: Secondary | ICD-10-CM | POA: Diagnosis present

## 2015-01-16 HISTORY — DX: Female genital prolapse, unspecified: N81.9

## 2015-01-16 HISTORY — DX: Nocturia: R35.1

## 2015-01-16 HISTORY — DX: Personal history of other diseases of the digestive system: Z87.19

## 2015-01-16 HISTORY — DX: Personal history of adenomatous and serrated colon polyps: Z86.0101

## 2015-01-16 HISTORY — DX: Sarcoidosis of lung: D86.0

## 2015-01-16 HISTORY — PX: ANTERIOR AND POSTERIOR REPAIR: SHX5121

## 2015-01-16 HISTORY — DX: Personal history of colonic polyps: Z86.010

## 2015-01-16 HISTORY — DX: Gastro-esophageal reflux disease without esophagitis: K21.9

## 2015-01-16 SURGERY — ANTERIOR (CYSTOCELE) AND POSTERIOR REPAIR (RECTOCELE)
Anesthesia: General | Site: Vagina

## 2015-01-16 MED ORDER — DEXAMETHASONE SODIUM PHOSPHATE 4 MG/ML IJ SOLN
INTRAMUSCULAR | Status: DC | PRN
  Administered 2015-01-16: 10 mg via INTRAVENOUS

## 2015-01-16 MED ORDER — SENNA 8.6 MG PO TABS
1.0000 | ORAL_TABLET | Freq: Two times a day (BID) | ORAL | Status: DC
  Filled 2015-01-16: qty 1

## 2015-01-16 MED ORDER — BELLADONNA ALKALOIDS-OPIUM 16.2-60 MG RE SUPP
RECTAL | Status: DC | PRN
  Administered 2015-01-16: 1 via RECTAL

## 2015-01-16 MED ORDER — KETOROLAC TROMETHAMINE 30 MG/ML IJ SOLN
INTRAMUSCULAR | Status: DC | PRN
  Administered 2015-01-16: 30 mg via INTRAVENOUS

## 2015-01-16 MED ORDER — LACTATED RINGERS IV SOLN
INTRAVENOUS | Status: DC
  Administered 2015-01-16 (×2): via INTRAVENOUS
  Filled 2015-01-16: qty 1000

## 2015-01-16 MED ORDER — MENTHOL 3 MG MT LOZG
1.0000 | LOZENGE | OROMUCOSAL | Status: DC | PRN
  Filled 2015-01-16 (×2): qty 9

## 2015-01-16 MED ORDER — LACTATED RINGERS IV SOLN
INTRAVENOUS | Status: DC
  Administered 2015-01-16 – 2015-01-17 (×2): via INTRAVENOUS
  Filled 2015-01-16: qty 1000

## 2015-01-16 MED ORDER — MEPERIDINE HCL 25 MG/ML IJ SOLN
6.2500 mg | INTRAMUSCULAR | Status: DC | PRN
  Filled 2015-01-16: qty 1

## 2015-01-16 MED ORDER — SODIUM CHLORIDE 0.9 % IJ SOLN
INTRAMUSCULAR | Status: DC | PRN
  Administered 2015-01-16: 50 mL via INTRAVENOUS

## 2015-01-16 MED ORDER — GLYCOPYRROLATE 0.2 MG/ML IJ SOLN
INTRAMUSCULAR | Status: DC | PRN
  Administered 2015-01-16: 0.2 mg via INTRAVENOUS

## 2015-01-16 MED ORDER — SODIUM CHLORIDE 0.9 % IR SOLN
Status: DC | PRN
  Administered 2015-01-16: 500 mL

## 2015-01-16 MED ORDER — ACETAMINOPHEN 10 MG/ML IV SOLN: INTRAVENOUS | Status: DC | PRN

## 2015-01-16 MED ORDER — ONDANSETRON HCL 4 MG/2ML IJ SOLN
INTRAMUSCULAR | Status: DC | PRN
  Administered 2015-01-16: 4 mg via INTRAVENOUS

## 2015-01-16 MED ORDER — PROPOFOL 10 MG/ML IV BOLUS
INTRAVENOUS | Status: DC | PRN
  Administered 2015-01-16: 50 mg via INTRAVENOUS
  Administered 2015-01-16: 150 mg via INTRAVENOUS

## 2015-01-16 MED ORDER — ESTRADIOL 0.1 MG/GM VA CREA
TOPICAL_CREAM | VAGINAL | Status: DC | PRN
  Administered 2015-01-16: 1 via VAGINAL

## 2015-01-16 MED ORDER — KETOROLAC TROMETHAMINE 30 MG/ML IJ SOLN
INTRAMUSCULAR | Status: AC
  Filled 2015-01-16: qty 1

## 2015-01-16 MED ORDER — OXYCODONE HCL 5 MG PO TABS
5.0000 mg | ORAL_TABLET | ORAL | Status: DC | PRN
  Administered 2015-01-17: 5 mg via ORAL
  Filled 2015-01-16: qty 1

## 2015-01-16 MED ORDER — BUPIVACAINE-EPINEPHRINE 0.5% -1:200000 IJ SOLN
INTRAMUSCULAR | Status: DC | PRN
  Administered 2015-01-16: 50 mL

## 2015-01-16 MED ORDER — BELLADONNA ALKALOIDS-OPIUM 16.2-60 MG RE SUPP
RECTAL | Status: AC
  Filled 2015-01-16: qty 1

## 2015-01-16 MED ORDER — FENTANYL CITRATE 0.05 MG/ML IJ SOLN
INTRAMUSCULAR | Status: AC
  Filled 2015-01-16: qty 4

## 2015-01-16 MED ORDER — KETOROLAC TROMETHAMINE 15 MG/ML IJ SOLN
15.0000 mg | Freq: Four times a day (QID) | INTRAMUSCULAR | Status: DC
  Administered 2015-01-16 – 2015-01-17 (×3): 15 mg via INTRAVENOUS
  Filled 2015-01-16: qty 1

## 2015-01-16 MED ORDER — ACETAMINOPHEN 10 MG/ML IV SOLN
INTRAVENOUS | Status: DC | PRN
  Administered 2015-01-16: 1000 mg via INTRAVENOUS

## 2015-01-16 MED ORDER — PROMETHAZINE HCL 25 MG/ML IJ SOLN
6.2500 mg | INTRAMUSCULAR | Status: DC | PRN
  Filled 2015-01-16: qty 1

## 2015-01-16 MED ORDER — CIPROFLOXACIN HCL 500 MG PO TABS
ORAL_TABLET | ORAL | Status: AC
  Filled 2015-01-16: qty 1

## 2015-01-16 MED ORDER — DOCUSATE SODIUM 100 MG PO CAPS
100.0000 mg | ORAL_CAPSULE | Freq: Two times a day (BID) | ORAL | Status: DC
  Filled 2015-01-16: qty 1

## 2015-01-16 MED ORDER — DIPHENHYDRAMINE HCL 12.5 MG/5ML PO ELIX
12.5000 mg | ORAL_SOLUTION | Freq: Four times a day (QID) | ORAL | Status: DC | PRN
  Filled 2015-01-16: qty 5

## 2015-01-16 MED ORDER — SODIUM CHLORIDE 0.9 % IV SOLN
1.0000 g | Freq: Four times a day (QID) | INTRAVENOUS | Status: DC
  Filled 2015-01-16: qty 1000

## 2015-01-16 MED ORDER — CIPROFLOXACIN HCL 500 MG PO TABS
500.0000 mg | ORAL_TABLET | Freq: Two times a day (BID) | ORAL | Status: DC
  Administered 2015-01-16 – 2015-01-17 (×2): 500 mg via ORAL
  Filled 2015-01-16: qty 1

## 2015-01-16 MED ORDER — ONDANSETRON HCL 4 MG/2ML IJ SOLN
4.0000 mg | INTRAMUSCULAR | Status: DC | PRN
  Filled 2015-01-16: qty 2

## 2015-01-16 MED ORDER — DIPHENHYDRAMINE HCL 50 MG/ML IJ SOLN
12.5000 mg | Freq: Four times a day (QID) | INTRAMUSCULAR | Status: DC | PRN
  Filled 2015-01-16: qty 0.25

## 2015-01-16 MED ORDER — FENTANYL CITRATE 0.05 MG/ML IJ SOLN
INTRAMUSCULAR | Status: DC | PRN
  Administered 2015-01-16 (×3): 50 ug via INTRAVENOUS

## 2015-01-16 MED ORDER — CEFAZOLIN SODIUM-DEXTROSE 2-3 GM-% IV SOLR
2.0000 g | INTRAVENOUS | Status: AC
  Administered 2015-01-16: 2 g via INTRAVENOUS
  Filled 2015-01-16: qty 50

## 2015-01-16 MED ORDER — MIDAZOLAM HCL 2 MG/2ML IJ SOLN
INTRAMUSCULAR | Status: AC
Start: 2015-01-16 — End: 2015-01-16
  Filled 2015-01-16: qty 2

## 2015-01-16 MED ORDER — FENTANYL CITRATE 0.05 MG/ML IJ SOLN
25.0000 ug | INTRAMUSCULAR | Status: DC | PRN
  Filled 2015-01-16: qty 1

## 2015-01-16 MED ORDER — LACTATED RINGERS IV SOLN
INTRAVENOUS | Status: DC
  Filled 2015-01-16: qty 1000

## 2015-01-16 MED ORDER — EPHEDRINE SULFATE 50 MG/ML IJ SOLN
INTRAMUSCULAR | Status: DC | PRN
  Administered 2015-01-16: 10 mg via INTRAVENOUS
  Administered 2015-01-16: 5 mg via INTRAVENOUS

## 2015-01-16 MED ORDER — STERILE WATER FOR IRRIGATION IR SOLN
Status: DC | PRN
  Administered 2015-01-16: 3500 mL

## 2015-01-16 MED ORDER — MIDAZOLAM HCL 5 MG/5ML IJ SOLN
INTRAMUSCULAR | Status: DC | PRN
  Administered 2015-01-16: 2 mg via INTRAVENOUS

## 2015-01-16 MED ORDER — LIDOCAINE HCL (CARDIAC) 20 MG/ML IV SOLN
INTRAVENOUS | Status: DC | PRN
  Administered 2015-01-16: 50 mg via INTRAVENOUS

## 2015-01-16 SURGICAL SUPPLY — 47 items
ALLOGRAFT TIS AXIS TUTPLST 4X7 (Mesh General) ×1 IMPLANT
BAG URINE DRAINAGE (UROLOGICAL SUPPLIES) ×2 IMPLANT
BLADE CLIPPER SURG (BLADE) ×2 IMPLANT
BLADE SURG 10 STRL SS (BLADE) ×2 IMPLANT
BLADE SURG 15 STRL LF DISP TIS (BLADE) ×1 IMPLANT
BLADE SURG 15 STRL SS (BLADE) ×1
BOOTIES KNEE HIGH SLOAN (MISCELLANEOUS) ×2 IMPLANT
CANISTER SUCTION 2500CC (MISCELLANEOUS) ×2 IMPLANT
CAPIO 0 ×4 IMPLANT
CATH FOLEY 2WAY SLVR  5CC 16FR (CATHETERS) ×1
CATH FOLEY 2WAY SLVR 5CC 16FR (CATHETERS) ×1 IMPLANT
CLOTH BEACON ORANGE TIMEOUT ST (SAFETY) ×2 IMPLANT
COVER MAYO STAND STRL (DRAPES) ×2 IMPLANT
COVER TABLE BACK 60X90 (DRAPES) ×2 IMPLANT
DEVICE CAPIO SLIM SINGLE (INSTRUMENTS) ×2 IMPLANT
DRAPE CAMERA CLOSED 9X96 (DRAPES) ×2 IMPLANT
DRAPE UNDERBUTTOCKS STRL (DRAPE) ×2 IMPLANT
GAUZE SPONGE 4X4 16PLY XRAY LF (GAUZE/BANDAGES/DRESSINGS) ×2 IMPLANT
GLOVE BIO SURGEON STRL SZ 6.5 (GLOVE) ×2 IMPLANT
GLOVE BIO SURGEON STRL SZ7.5 (GLOVE) ×4 IMPLANT
GLOVE BIOGEL PI IND STRL 6.5 (GLOVE) ×2 IMPLANT
GLOVE BIOGEL PI INDICATOR 6.5 (GLOVE) ×2
GOWN STRL REUS W/ TWL XL LVL3 (GOWN DISPOSABLE) ×2 IMPLANT
GOWN STRL REUS W/TWL XL LVL3 (GOWN DISPOSABLE) ×2
LIQUID BAND (GAUZE/BANDAGES/DRESSINGS) IMPLANT
NEEDLE HYPO 22GX1.5 SAFETY (NEEDLE) ×4 IMPLANT
PACKING VAGINAL (PACKING) ×2 IMPLANT
PENCIL BUTTON HOLSTER BLD 10FT (ELECTRODE) ×2 IMPLANT
PLUG CATH AND CAP STER (CATHETERS) ×2 IMPLANT
RETRACTOR LONRSTAR 16.6X16.6CM (MISCELLANEOUS) ×1 IMPLANT
RETRACTOR STAY HOOK 5MM (MISCELLANEOUS) ×2 IMPLANT
RETRACTOR STER APS 16.6X16.6CM (MISCELLANEOUS) ×2
SET IRRIG Y TYPE TUR BLADDER L (SET/KITS/TRAYS/PACK) ×2 IMPLANT
SHEET LAVH (DRAPES) ×2 IMPLANT
SPONGE LAP 4X18 X RAY DECT (DISPOSABLE) ×2 IMPLANT
SUCTION FRAZIER TIP 10 FR DISP (SUCTIONS) ×2 IMPLANT
SUT CAPIO PGA 48IN SZ 0 (SUTURE) ×4 IMPLANT
SUT VIC AB 2-0 UR6 27 (SUTURE) ×16 IMPLANT
SUT VIC AB 3-0 SH 27 (SUTURE) ×1
SUT VIC AB 3-0 SH 27X BRD (SUTURE) ×1 IMPLANT
SYR BULB IRRIGATION 50ML (SYRINGE) ×2 IMPLANT
SYR CONTROL 10ML LL (SYRINGE) ×4 IMPLANT
SYRINGE 10CC LL (SYRINGE) ×2 IMPLANT
TISSUE AXIS TUTOPLAST 4X7 (Mesh General) ×2 IMPLANT
TRAY DSU PREP LF (CUSTOM PROCEDURE TRAY) ×2 IMPLANT
TUBE CONNECTING 12X1/4 (SUCTIONS) ×4 IMPLANT
WATER STERILE IRR 500ML POUR (IV SOLUTION) ×2 IMPLANT

## 2015-01-16 NOTE — Interval H&P Note (Signed)
History and Physical Interval Note:  01/16/2015 9:13 AM  Jeanne Chang  has presented today for surgery, with the diagnosis of Stage 4 Pelvic Floor Prolapse, Anterior Vault Prolapse  The various methods of treatment have been discussed with the patient and family. After consideration of risks, benefits and other options for treatment, the patient has consented to  Procedure(s): ANTERIOR VAULT SUSPENSION WITH COLOPLAST GRAFT AND CAPPIO DEVICE WITH PERMANENT SUTURES   (N/A) as a surgical intervention .  The patient's history has been reviewed, patient examined, no change in status, stable for surgery.  I have reviewed the patient's chart and labs.  Questions were answered to the patient's satisfaction.   Pt seen and case reviewed with her and sister again. Note hx of Stage 4 POP, and hx of Sarcoidosis, just off prednisone for 6 months. 40 yrs post hysterectomy. No incontinence, even with reduction of POP, with weak detrussor contraction. CT shows no hydro. We have had discussed mesh vs non-mesh alternatives for her repair, and I have explained that I will probably use Coloplast tissue for repair instead of mesh today because of her hx of prednisone use, and possible thinning of the vaginal wall tissue increasing th possibility of mesh exposure. She understands.   Carolan Clines I

## 2015-01-16 NOTE — Anesthesia Postprocedure Evaluation (Signed)
  Anesthesia Post-op Note  Patient: Jeanne Chang  Procedure(s) Performed: Procedure(s) (LRB): ANTERIOR VAULT REPIAR WITH KELLY PLICATION AND APICAL SACROSPINUS SUSPENSION WITH COLOPLAST AUGMENTATION/ CYSTOSCOPY COLD CUP BLADDER BIOPSY AND CAUTERIZATION AT THE BLADDER DOME (N/A)  Patient Location: PACU  Anesthesia Type: General  Level of Consciousness: awake and alert   Airway and Oxygen Therapy: Patient Spontanous Breathing  Post-op Pain: mild  Post-op Assessment: Post-op Vital signs reviewed, Patient's Cardiovascular Status Stable, Respiratory Function Stable, Patent Airway and No signs of Nausea or vomiting  Last Vitals:  Filed Vitals:   01/16/15 1145  BP: 90/49  Pulse: 75  Temp: 37.3 C  Resp: 15    Post-op Vital Signs: stable   Complications: No apparent anesthesia complications

## 2015-01-16 NOTE — Anesthesia Preprocedure Evaluation (Signed)
Anesthesia Evaluation  Patient identified by MRN, date of birth, ID band Patient awake    Reviewed: Allergy & Precautions, NPO status , Patient's Chart, lab work & pertinent test results  Airway Mallampati: II  TM Distance: >3 FB Neck ROM: Full    Dental no notable dental hx.    Pulmonary  Sarcoidosis of lung breath sounds clear to auscultation  Pulmonary exam normal       Cardiovascular negative cardio ROS  Rhythm:Regular Rate:Normal     Neuro/Psych negative neurological ROS  negative psych ROS   GI/Hepatic Neg liver ROS, hiatal hernia, GERD-  Controlled,  Endo/Other  negative endocrine ROS  Renal/GU negative Renal ROS  negative genitourinary   Musculoskeletal negative musculoskeletal ROS (+)   Abdominal   Peds negative pediatric ROS (+)  Hematology negative hematology ROS (+)   Anesthesia Other Findings   Reproductive/Obstetrics negative OB ROS                             Anesthesia Physical Anesthesia Plan  ASA: II  Anesthesia Plan: General   Post-op Pain Management:    Induction: Intravenous  Airway Management Planned: Oral ETT  Additional Equipment:   Intra-op Plan:   Post-operative Plan: Extubation in OR  Informed Consent: I have reviewed the patients History and Physical, chart, labs and discussed the procedure including the risks, benefits and alternatives for the proposed anesthesia with the patient or authorized representative who has indicated his/her understanding and acceptance.   Dental advisory given  Plan Discussed with: CRNA  Anesthesia Plan Comments:         Anesthesia Quick Evaluation

## 2015-01-16 NOTE — H&P (Signed)
Reason For Visit F/u to review CT & urodynamic results   Active Problems Problems  1. Cystocele, midline (N81.11)   Assessed By: Carolan Clines (Urology); Last Assessed: 13 Dec 2014 2. Postmenopausal atrophic vaginitis (N95.2)   Assessed By: Carolan Clines (Urology); Last Assessed: 13 Dec 2014 3. Prolapse of vaginal wall with midline cystocele (N81.11)   Assessed By: Carolan Clines (Urology); Last Assessed: 13 Dec 2014 4. Prolapse Vaginal Cuff At The Introitus   Assessed By: Carolan Clines (Urology); Last Assessed: 13 Dec 2014  History of Present Illness 64 year old female returns today to review CT & urodynamic results. Originally referred by Dr. Tresa Moore, for evaluation of anterior vault pelvic organ prolapse 7 years. The patient is para 2-1-1 (vaginal delivery), with much worsening over the last 6 months since beginning intentional weight loss. There is no history of tobacco, no family history of pelvic organ prolapse. She has a history of sarcoidosis, treated with prednisone for 5 years, but currently off medication for 6 months. She has chronic constipation treated with Metamucil. She is 40 years post hysterectomy.    She complains of 6 months of pelvic pressure, worse when standing, or edema of the day. Pelvic pressure is much worse after recent intentional weight loss. She digitates in order to reduce the prolapse, but is able to void without cystocele reduction. She denies stress or urge incontinence. Her bladder is noted to protrude 2 inches beyond the vaginal introitus, with standing, or coughing. Post void residual in 2015 was 0 cc.  She notes urgency, but no urge incontinence.   Physical examination showed poorly estrogenized vaginal epithelium. There was no discharge, no tenderness, with cystocele present with a midline defect, grade 3-4 over 4. There was no enterocele, no rectocele. Marshal test was negative. Q-tip test was positive, with  greater than 30 movement of the urethra, but no leakage, with or without the prolapse reduced.    Video urodynamics on 11/28/14, shows a maximum cystometric capacity of 415 cc, with first sensation at 197cc, and a strong desire at 256 cc. The patient does have a small unstable bladder contraction at maximum fill, of 6 cm H2O, with no urinary leakage because she is able to inhibit this contraction.    The patient does not leak for leak point pressure determination, with a maximum abdominal pressure generation of 92 cm of water. There was no stress incontinence with or without reduction of her prolapse.    Her pressure flow study shows that she is able to void 375 cc, with a maximum flow rate of 23 cc/s, at a detrusor pressure at maximum flow of 11cm H2O..   She is felt to have significant anterior vault prolapse, stage III, with apical descent. She does not have coincidental stress urinary incontinence. She has a relatively weak detrusor contraction. She has a history of sarcoidosis, treated recently with prednisone. For these reasons, she will need tissue-augmented cystocele reduction, with A-Cell tissue, and apical sacrospinous fixation via transvaginal route. She does not appear to need additional midurethral sling.   Past Medical History Problems  1. History of arthritis (Z87.39) 2. History of cardiac murmur (Z86.79)  Surgical History Problems  1. History of Cholecystectomy 2. History of Hysterectomy 3. History of Tonsillectomy  Current Meds 1. Iron TABS;  Therapy: (Recorded:03Nov2015) to Recorded  Allergies Medication  1. Tetanus Toxoid Adsorbed SOLN  Family History Problems  1. Family history of Death  of family member : Mother   Mom died at age 109 from heart attack 2. Family history of Nephrolithiasis, uric acid : Other   nephew  Social History Problems  1. Denied: History of Alcohol use 2. Caffeine use (F15.90)   1-2 per week 3. Married 4. Never smoked  tobacco (Z78.9) 5. Number of children   1 son 6. Occupation   hair stylist  Review of Systems Genitourinary, constitutional, skin, eye, otolaryngeal, hematologic/lymphatic, cardiovascular, pulmonary, endocrine, musculoskeletal, gastrointestinal, neurological and psychiatric system(s) were reviewed and pertinent findings if present are noted and are otherwise negative.  Genitourinary: urinary urgency and nocturia, but no urinary frequency, no dysuria, no urinary hesitancy, no incontinence, urinary stream does not start and stop, no incomplete emptying of bladder, no pelvic pain and initiating urination does not require straining.  Gastrointestinal: constipation, but no diarrhea.  Constitutional: night sweats.  Musculoskeletal: back pain.    Vitals Vital Signs [Data Includes: Last 1 Day]  Recorded: [id number] 05:16PM  Blood Pressure: 156 / 77 Temperature: 98 F Heart Rate: 58  Physical Exam Constitutional: Well nourished and well developed . No acute distress.  ENT:. The ears and nose are normal in appearance.  Neck: The appearance of the neck is normal and no neck mass is present.  Pulmonary: No respiratory distress and normal respiratory rhythm and effort.  Cardiovascular:. No peripheral edema.  Abdomen: The abdomen is rounded. The abdomen is soft and nontender. No masses are palpated. The abdomen is normal to percussion. Bowel sounds are normal. No hernias are palpable.  No inguinal hernia is present on the right.  No inguinal hernia is present on the left.  Genitourinary:. Small entroitus.  Chaperone Present: kim lewis.  Examination of the external genitalia shows normal female external genitalia, no vulvar mass and no labial adhesions. Vaginal exam demonstrates the vaginal epithelium to be poorly estrogenized, but no abnormalities, no discharge, no tenderness, no uterine prolapse and no mass. A cystocele is present with a midline defect (grade 3-4 /4). No enterocele is identified.  No rectocele is identified. There is no evidence of a vesicovaginal fistula. The cervix is is absent. The uterus is absent. The bladder is normal on palpation, non tender, not distended and without masses. The anus is normal on inspection. The perineum is normal on inspection.  Lymphatics: The femoral and inguinal nodes are not enlarged or tender.  Skin: Normal skin turgor, no visible rash and no visible skin lesions.  Neuro/Psych:. Mood and affect are appropriate.    Results/Data Urine [Data Includes: Last 1 Day]   [id number]  COLOR STRAW   APPEARANCE CLEAR   SPECIFIC GRAVITY 1.010   pH 5.5   GLUCOSE NEG mg/dL  BILIRUBIN NEG   KETONE NEG mg/dL  BLOOD NEG   PROTEIN NEG mg/dL  UROBILINOGEN 0.2 mg/dL  NITRITE NEG   LEUKOCYTE ESTERASE NEG   Selected Results  BUN & CREATININE [id number] 02:41PM Carolan Clines  SPECIMEN TYPE: BLOOD   Test Name Result Flag Reference  CREATININE 0.90 mg/dL  0.50-1.40  BUN 14 mg/dL  6-30  Est GFR, African American 79 mL/min    Est GFR, NonAfrican American 68 mL/min    THE ESTIMATED GFR IS A CALCULATION VALID FOR ADULTS (>=69 YEARS OLD) THAT USES THE CKD-EPI ALGORITHM TO ADJUST FOR AGE AND SEX. IT IS   NOT TO BE USED FOR CHILDREN, PREGNANT WOMEN, HOSPITALIZED PATIENTS,    PATIENTS ON DIALYSIS, OR WITH RAPIDLY CHANGING KIDNEY FUNCTION. ACCORDING TO THE NKDEP, EGFR >89 IS NORMAL,  60-89 SHOWS MILD IMPAIRMENT, 30-59 SHOWS MODERATE IMPAIRMENT, 15-29 SHOWS SEVERE IMPAIRMENT AND <15 IS ESRD.     PERFORMED AT:        [facility]                      [address]                      [place], [place] [address]   CT-ABD/PELVIS WITH CONTRAST [id number] 12:00AM Carolan Clines   Test Name Result Flag Reference  CT-ABD/PELVIS WITH CONTRAST (Report)    ** RADIOLOGY REPORT BY [facility] **   CLINICAL DATA: Cystocele, pelvic pressure for 6 months.  EXAM: CT ABDOMEN AND PELVIS WITH CONTRAST  TECHNIQUE: Multidetector CT  imaging of the abdomen and pelvis was performed using the standard protocol following bolus administration of intravenous contrast.  CONTRAST: 100 cc Isovue-300.  COMPARISON: None.  FINDINGS: Lower chest: Lung bases show no acute findings. Heart size normal. Coronary artery calcification. No pericardial or pleural effusion.  Hepatobiliary: Biliary ductal dilatation may relate to cholecystectomy. Liver is otherwise unremarkable.  Pancreas: Negative.  Spleen: Negative.  Adrenals/Urinary Tract: Adrenal glands are unremarkable. Prominent extrarenal pelves bilaterally. Kidneys are unremarkable. Ureters are decompressed. Bladder is unremarkable and does not appear to extend beyond the pelvic floor margin.  Stomach/Bowel: Stomach and small bowel are unremarkable. Appendix is not readily visualized. Stool is seen in the majority of the colon.  Vascular/Lymphatic: Atherosclerotic calcification of the arterial vasculature without abdominal aortic aneurysm. Retroaortic left renal vein. No pathologically enlarged lymph nodes.  Reproductive: Uterus is not readily visualized, nor the ovaries.  Other: No free fluid. Mesenteries and peritoneum are grossly unremarkable.  Musculoskeletal: No worrisome lytic or sclerotic lesions.  IMPRESSION: 1. No definitive evidence of a cystocele. No urinary findings to explain the patient's pelvic pressure. 2. Stool in the majority the colon is indicative of constipation. 3. Coronary artery calcification.   Electronically Signed  By: Lorin Picket M.D.  On: 12/07/2014 16:35   Assessment Assessed  1. Prolapse of vaginal wall with midline cystocele (N81.11) 2. Prolapse Vaginal Cuff At The Introitus 3. Cystocele, midline (N81.11) 4. Postmenopausal atrophic vaginitis (N95.2)  64 yo female, P 2-1-1, 40 yrs post hysterectomy, and with 7 years of progressive anterior vault prolapse, now with Stage 4 POP, 3 cm external to the vaginal introitus. She  complains of pelvic pressure, and pain, especially when exercising ( squats). Her symptoms have accelerated since her weight loss, and she now digitates in order to void. Note complicating history of Sarcoidosis, now off prednisone for > 6 months. Physical exam shows negative Marshall test, with atrophic vaginitis. PMH significant for chronic constipation.    Videourodynamics shows small to normal capacity bladder, with normal sensation, and emptying. She did not leak for leak point pressure determination, with a bladder pressure of 92cm H20. Maximum flow rate is 23cc/sec, with low detrusor contraction pressure of 11cm H20.     I have explained that Mrs. Whitelaw has been found to have a hernia-or weakness- in the vaginal wall, allowing one or more organs to begin to descend-both by the pull of gravity, and by the push of abdominal straining-into, and through-the vagina. This anatomic herniation is called Pelvic Organ Prolapse (POP).   She does not have: (1) weakness of the urethra-resulting in "stress "(cough laugh, or sneeze) urinary incontinence; or (2), weakness of the posterior vaginal wall, resulting in an Enterocele (small intestine herniating  high up through the posterior vaginal wall), or a Rectocele (rectum herniating through the posterior vaginal wall); but she does have a ) weakness of the anterior vaginal wall, resulting in a Cystocele   These anatomic problems may be directly related to childbirth, neuropathies, diabetes, constipation, cigarette smoking, steroid use, diseases of the connective tissue (e.g.: lupus, steroids for chronic asthma), or previous pelvic surgery, radiation, or chemotherapy. Note that Mrs Stave has a hx of Sarcoidosis, causing significant autoimmune inflammation, treated with prednisone-which inhibits tissue healing and scar formation-in addition to inhibiting inflammation from Sarcoidosis.   The surgical repair which is recommended for you is based on an integration of  the symptoms, medical history, physical examination; as well as the video urodynamic studies, and laboratory evaluation. In the end, the final anatomic evaluation is performed under anesthesia, and it is for this reason, that the end result operation may be slightly different than the preoperative evaluation might suggest. We are prepared to repair the pelvic floor in any number of ways, which have be explained to you, including both laparoscopic Robotic sacrocolpopexy, as well as anterior vault sacrospinous suspension.   Pelvic floor repair was first reported in 1913, with success rates of 50-90%. However, long-term followup of repairs using absorbable tissue has shown 20% to 50% recurrence rates. Therefore, when synthetic mesh was introduced into pelvic floor repairs in 2000, it was quickly incorporated into surgical approaches. However, early evaluation of surgical mesh (no longer in use)(2000 -2005) showed technical flaws which led to erosion of the mesh through the vaginal incision. This has led to multiple lawsuits with regard to the use of vaginal mesh. Currently, mesh erosion is noted to be a complication of pelvic organ prolapse surgery, as reported in 2-11% of vaginal cases, and 9.8% of Robotic surgical sacrocolpopexy cases.    Evaluation of a single surgeon's experience with the newest vaginal mesh [employer] [facility] [facility] [employer] mesh, Gaynelle Arabian, January 2009 through August 2011) has shown no erosions from pubovaginal sling surgery, and a 2% erosion rate from other larger pelvic floor organ repair surgery. These extrusions have been treated by vaginal hormone treatment, and surgical excision of a tiny portion of the extruded mesh. There have been no severe long-term complications noted (up to 3 year follow-up).   There are several approaches for repair for pelvic organ prolapse. In the natural orifice surgery approach, the anterior vault suspension is accomplished by placing mesh between the bladder and vaginal  wall, and attaching the mesh to the sacrospinous ligaments. This prevents the anterior vault hernia from recurring. If the hysterectomy is accomplished at the same time, or if the patient is on prednisone, then absorbable tissue is recommended to be used instead of mesh, because of an increased chance of mesh erosion at the incision line. Anterior vault suspension can also be accomplished via the laparoscopic robotic approach. While this approach gives excellent results, it is more expensive, and more invasive. Transvaginal surgery allows the patient to have natural orifice surgical approach ("Notes" approach), which is less invasive and less expensive, while providing superb results. Recent evaluation (see above) has shown a single surgeon's results with 100 cases of anterior and posterior vault suspension using [employer] mesh. These results show a 2% recurrence rate, 2% extrusion rate, on patients with grade 4 prolapse (worst prolapse).    Common questions for patient's regarding pelvic organ prolapse are as follows:   #1 Is mesh to be used in my surgery? ( no. Tissue augmentation with ACELL. Will need 6 months  to dissolve. Will stimulate stem cell production to heal wound.)   #2  Am I a good candidate for mesh? ( no because this is 1st repair, and because of prednisone exposure-high risk for mesh complication)  #3 Why is mesh the best option for my repair? ( would use if fails primary tissue-augmented repair)   #4 what are the alternatives to transvaginal mesh for repair of my prolapse? ( laparoscopic robotic repair with mesh sacrocolpopexy)  #5 could my surgery be performed without using mesh? ( as above)  #6 Will my partner be able to feel the surgical mesh during sexual intercourse, and what if the mesh erodes through the vaginal wall? ( no mesh)   #7 how often have you, as a Psychologist, sport and exercise, implanted this particular product? ( exclusively for 2 years)  #8 what results have your other  patients had with this product? ( no complications. single pt with post op vaginitis)  #9 what can I expect to feel after surgery, and for how long? ( tightness, need for PT post op. Need for vaginal estrogen. Avoid constipation)   #10 which specific side effects should I report after surgery? ( bleeding , drainage, fever)  As per the FDA communication of July 2011, is important to understand that Dr.Lexee Brashears has had 33 years of surgical experience in incontinence and pelvic organ repair, with special training in the implantation of pelvic organ prolapse mesh. I am an Art therapist in the [employer] pelvic organ prolapse surgical teaching laboratories. In consideration of all types of pelvic organ repairs, please know that we have considered" no repair"; repair via vaginal approach; repair via abdominal approach-using native tissue, absorbable tissue, and nonabsorbable mesh.   Plan Cystocele, midline  1. Follow-up Schedule Surgery Office  Follow-up  Status: Hold For -  Appointment  Requested for: [id number] Health Maintenance  2. UA With REFLEX; [Do Not Release]; Status:Resulted - Requires  Verification;   Done: [id number] 04:16PM  Schedule surgery. [facility]  Overnight stay wiith packing and foley.  Bowel prep pre-op  post op PT   Discussion/Summary cc: Dr. Tresa Moore  cc: Dr. Carren Rang   A total of 65 minutes were spent in the overall care of the patient today with 65 minutes in direct face to face consultation.    Signatures Electronically signed by : Carolan Clines, M.D.; Dec 13 2014  6:45PM EST

## 2015-01-16 NOTE — Anesthesia Procedure Notes (Signed)
Procedure Name: LMA Insertion Date/Time: 01/16/2015 9:22 AM Performed by: Bethena Roys T Pre-anesthesia Checklist: Patient identified, Emergency Drugs available, Suction available and Patient being monitored Patient Re-evaluated:Patient Re-evaluated prior to inductionOxygen Delivery Method: Circle System Utilized Preoxygenation: Pre-oxygenation with 100% oxygen Intubation Type: IV induction Ventilation: Mask ventilation without difficulty LMA: LMA inserted LMA Size: 4.0 Number of attempts: 1 Airway Equipment and Method: Bite block Placement Confirmation: positive ETCO2 Dental Injury: Teeth and Oropharynx as per pre-operative assessment

## 2015-01-16 NOTE — Transfer of Care (Signed)
Immediate Anesthesia Transfer of Care Note  Patient: Jeanne Chang  Procedure(s) Performed: Procedure(s): ANTERIOR VAULT REPIAR WITH KELLY PLICATION AND APICAL SACROSPINUS SUSPENSION WITH COLOPLAST AUGMENTATION/ CYSTOSCOPY COLD CUP BLADDER BIOPSY AND CAUTERIZATION AT THE BLADDER DOME (N/A)  Patient Location: PACU  Anesthesia Type:General  Level of Consciousness: awake and oriented  Airway & Oxygen Therapy: Patient Spontanous Breathing and Patient connected to nasal cannula oxygen  Post-op Assessment: Report given to RN  Post vital signs: Reviewed and stable  Last Vitals:  Filed Vitals:   01/16/15 0716  BP: 125/68  Pulse: 55  Temp: 36.6 C  Resp: 16    Complications: No apparent anesthesia complications

## 2015-01-16 NOTE — Op Note (Signed)
Preoperative Diagnosis: cystocele  Postoperative Diagnosis:  Same, plus bladder mass.  Procedure(s) Performed:  1. Anterior vault suspension with human cadaveric dermis. 2. Cystoscopy with bladder biopsy and fulguration.  Teaching Surgeon:  Carolan Clines, MD  Resident Surgeon:  Marta Antu, MD  Assistant(s):  none  Anesthesia:  General via endotracheal tube  Fluids:  See anesthesia record  UOP: unable to obtain  Estimated blood loss:  200 mL  Specimens:   1. Bladder mass.  Drains:  40JW foley  Complications:  none  Indications: 64yF with symptomatic cystocele. After discussion of risks/benefits/alternative therapies, she elected for the above procedure.  Findings:   1. Simple cystocele consistent with clinic exam. Excellent reduction with Kelley plication and sacrospinous ligament fixation. 2. 15mm erythematous papillary mass on posterior dome, biopsied with coldcup forceps and fulgurated. 3. Clear efflux from bilateral ureters.  Description:  The patient was correctly identified in the preop holding area where written informed consent as well potential risk and complication reviewed. She agreed. They were brought back to the operative suite where a preinduction timeout was performed. Once correct information was verified, general anesthesia was induced via endotracheal tube. SCDs were placed for VTE prophylaxis. Prophylactic perioperative antibiotics were instilled. The patient was placed in dorsal lithotomy position with pressure points padded. Her genitalia were prepped with betadine and she was draped in usual sterile fashion. A foley catheter was inserted and clamped. A weighted vaginal speculum was inserted. A ring retractor with needle hooks was used for exposure. 0.25% marcaine with epi was injected for hydrodissection.  A 4cm lateral incision was made in the anterior vaginal mucosa sharply and developed with a combination of blunt dissection with  antibiotic-soaked sponges and sharp dissection with scissors. This was carried laterally to the vaginal fornices. The endopelvic fascia were incised bluntly and the sacrospinous ligaments were palpated. The tissue on top of this bony landmark was swept away with the finger. 2-0 prolene was thrown through bilateral sacrospinous ligaments with the capio needle driver. Our defect was measured at 7cm wide by 4cm long, and a piece of coloplast Axis Dermis was cut. (REF [id number]). The existing prolene was used to fix the back corners of this with a french eye needle. 2-0 vicryl was used to tack down the distal portion of the dermis to the anterior vaginal wall. The fit was excellent. 1cm of right lateral vaginal mucosa was found to be redundant, and was trimmed sharply. The vaginorrhaphy was performed with 2-0 vicryl in a running fashion. Hemostasis was excellent.   Cystoscopy was performed which revealed findings as above. Cold cup biopsy was used to sample the mass. Bugbee electrocautery was used to gain hemostasis easily. The foley was reinserted, vaginal packing with estrace cream was inserted, the patient was awoken from anesthesia and taken to the recovery room having tolerated the procedure well.   Post Op Plan:   1. Obs overnight. 2. Voiding trial and vaginal packing removal in AM. Hopefully d/c home without catheter.  Attestation:  Dr. Gaynelle Arabian was present and scrubbed for the entirety of the procedure.    Penny Arrambide "Jed" Glo Herring, MD Resident, Department of Urology

## 2015-01-17 DIAGNOSIS — N8111 Cystocele, midline: Secondary | ICD-10-CM | POA: Diagnosis not present

## 2015-01-17 LAB — HEMOGLOBIN AND HEMATOCRIT, BLOOD
HCT: 25.2 % — ABNORMAL LOW (ref 36.0–46.0)
HEMOGLOBIN: 8.5 g/dL — AB (ref 12.0–15.0)

## 2015-01-17 LAB — BASIC METABOLIC PANEL
ANION GAP: 4 — AB (ref 5–15)
BUN: 17 mg/dL (ref 6–23)
CHLORIDE: 98 mmol/L (ref 96–112)
CO2: 28 mmol/L (ref 19–32)
CREATININE: 0.8 mg/dL (ref 0.50–1.10)
Calcium: 8.5 mg/dL (ref 8.4–10.5)
GFR calc non Af Amer: 76 mL/min — ABNORMAL LOW (ref >90)
GFR, EST AFRICAN AMERICAN: 88 mL/min — AB (ref >90)
Glucose, Bld: 103 mg/dL — ABNORMAL HIGH (ref 70–99)
Potassium: 4.6 mmol/L (ref 3.5–5.1)
Sodium: 130 mmol/L — ABNORMAL LOW (ref 135–145)

## 2015-01-17 LAB — HEMOGLOBIN: Hemoglobin: 8.4 g/dL — ABNORMAL LOW (ref 12.0–15.0)

## 2015-01-17 MED ORDER — KETOROLAC TROMETHAMINE 30 MG/ML IJ SOLN
INTRAMUSCULAR | Status: AC
  Filled 2015-01-17: qty 1

## 2015-01-17 MED ORDER — OXYCODONE HCL 5 MG PO TABS
ORAL_TABLET | ORAL | Status: AC
  Filled 2015-01-17: qty 1

## 2015-01-17 NOTE — Discharge Summary (Signed)
.   Date of admission: 01/16/2015  Date of discharge: 01/17/2015  Admission diagnosis: cystocele  Discharge diagnosis: cystocele and bladder mass.  Secondary diagnoses: sarcoidosis  History and Physical: For full details, please see admission history and physical. Briefly, Jeanne Chang is a 64 y.o. year old patient with cystocele. After discussion of risks/benefits/alternative therapies, elected for cystocele repair.   Hospital Course: Underwent uncomplicated anterior vault suspension on 2/8. Cystoscopy revealed a small benign appearing bladder mass that was biopsied. POD#1 her hgb was 8.5. No prior values. No signs of bleeding. Her exam was benign with no abdominal pain, no vaginal bleeding, no gross hematuria. Hgb was rechecked to confirm stability and was 8.4. I suspect she is chronically low. Will ask her to follow up with her PCP regarding this.  Laboratory values:  Recent Labs  01/17/15 0540  HGB 8.5*  HCT 25.2*    Recent Labs  01/17/15 0540  CREATININE 0.80    Disposition: Home  Discharge instruction: The patient was instructed to be ambulatory but told to refrain from heavy lifting, strenuous activity, or driving. No sexual activity.  Discharge medications:    Medication List    ASK your doctor about these medications        ibuprofen 200 MG tablet  Commonly known as:  ADVIL,MOTRIN  Take 200 mg by mouth every 6 (six) hours as needed.     MIRALAX PO  Take by mouth daily.        Followup:   Call office to confirm.

## 2015-01-17 NOTE — Discharge Instructions (Addendum)
°  Post Anesthesia Home Care Instructions  Activity: Get plenty of rest for the remainder of the day. A responsible adult should stay with you for 24 hours following the procedure.  For the next 24 hours, DO NOT: -Drive a car -Paediatric nurse -Drink alcoholic beverages -Take any medication unless instructed by your physician -Make any legal decisions or sign important papers.  Meals: Start with liquid foods such as gelatin or soup. Progress to regular foods as tolerated. Avoid greasy, spicy, heavy foods. If nausea and/or vomiting occur, drink only clear liquids until the nausea and/or vomiting subsides. Call your physician if vomiting continues.  Special Instructions/Symptoms: Your throat may feel dry or sore from the anesthesia or the breathing tube placed in your throat during surgery. If this causes discomfort, gargle with warm salt water. The discomfort should disappear within 24 hours. HOME CARE INSTRUCTIONS FOR VAGINAL SLING  Activity:  -No heavy lifting/ straining as instructed by your physician.             -No sexual intercourse until your f/u visit Diet:  You may return to your normal diet tomorrow.   It is important to keep your       bowels regular during the postoperative period.  To avoid constipation, drink plenty of fluids during the day (8-10 glasses) and eat plenty of fresh fruits and vegetables.  Use a mild laxative or stool softener if necessary.  Wound Care:  You may begin showering tomorrow, or as instructed by your physician.      Return to Work as instructed by your physician.    Special Instructions:   Call your physician if any of these symptoms occur:   -temperature greater than 101.5 degrees Farenheit.   -redness, swelling or drainage at incision site.   -foul odor of your urine.   -a significant decrease in the amount of urine you have every day.   -severe pain not relieved by your pain medication.    Return to see your doctor .   Call to set up a  follow-up appointment.   Patient Signature:  ________________________________________________________  Nurse's Signature:  ________________________________________________________

## 2015-01-17 NOTE — Progress Notes (Signed)
Foley catheter and vaginal packing removed per order.  Pt tolerated procedure well.  Peripad and mesh panty replaced.  Minimal bleeding noted after packing removed.  Pt up in hall w/ assistance w/o c/o dizziness.  No c/o pain presently.

## 2015-01-17 NOTE — Progress Notes (Signed)
Called and reported repeat hgb 8.4 to Dr. Gaynelle Arabian, no new orders. Okay to be discharged home.

## 2015-01-18 ENCOUNTER — Encounter (HOSPITAL_BASED_OUTPATIENT_CLINIC_OR_DEPARTMENT_OTHER): Payer: Self-pay | Admitting: Urology

## 2015-08-15 ENCOUNTER — Other Ambulatory Visit: Payer: Self-pay

## 2015-08-15 DIAGNOSIS — Z1231 Encounter for screening mammogram for malignant neoplasm of breast: Secondary | ICD-10-CM

## 2015-09-18 ENCOUNTER — Ambulatory Visit
Admission: RE | Admit: 2015-09-18 | Discharge: 2015-09-18 | Disposition: A | Discharge status: Home / Self Care | Payer: Commercial Managed Care - HMO | Source: Ambulatory Visit

## 2015-09-18 DIAGNOSIS — Z1231 Encounter for screening mammogram for malignant neoplasm of breast: Secondary | ICD-10-CM

## 2016-01-15 DIAGNOSIS — M6281 Muscle weakness (generalized): Secondary | ICD-10-CM | POA: Diagnosis not present

## 2016-01-15 DIAGNOSIS — R278 Other lack of coordination: Secondary | ICD-10-CM | POA: Diagnosis not present

## 2016-01-15 DIAGNOSIS — M62838 Other muscle spasm: Secondary | ICD-10-CM | POA: Diagnosis not present

## 2016-01-15 DIAGNOSIS — R102 Pelvic and perineal pain: Secondary | ICD-10-CM | POA: Diagnosis not present

## 2016-01-24 DIAGNOSIS — R278 Other lack of coordination: Secondary | ICD-10-CM | POA: Diagnosis not present

## 2016-01-24 DIAGNOSIS — M62838 Other muscle spasm: Secondary | ICD-10-CM | POA: Diagnosis not present

## 2016-01-24 DIAGNOSIS — R102 Pelvic and perineal pain: Secondary | ICD-10-CM | POA: Diagnosis not present

## 2016-01-24 DIAGNOSIS — M6281 Muscle weakness (generalized): Secondary | ICD-10-CM | POA: Diagnosis not present

## 2016-02-06 DIAGNOSIS — M6281 Muscle weakness (generalized): Secondary | ICD-10-CM | POA: Diagnosis not present

## 2016-02-06 DIAGNOSIS — R293 Abnormal posture: Secondary | ICD-10-CM | POA: Diagnosis not present

## 2016-02-06 DIAGNOSIS — R278 Other lack of coordination: Secondary | ICD-10-CM | POA: Diagnosis not present

## 2016-02-06 DIAGNOSIS — M62838 Other muscle spasm: Secondary | ICD-10-CM | POA: Diagnosis not present

## 2016-02-19 DIAGNOSIS — N8111 Cystocele, midline: Secondary | ICD-10-CM | POA: Diagnosis not present

## 2016-02-19 DIAGNOSIS — M6281 Muscle weakness (generalized): Secondary | ICD-10-CM | POA: Diagnosis not present

## 2016-02-19 DIAGNOSIS — R278 Other lack of coordination: Secondary | ICD-10-CM | POA: Diagnosis not present

## 2016-02-19 DIAGNOSIS — R293 Abnormal posture: Secondary | ICD-10-CM | POA: Diagnosis not present

## 2016-02-19 DIAGNOSIS — M62838 Other muscle spasm: Secondary | ICD-10-CM | POA: Diagnosis not present

## 2016-08-20 ENCOUNTER — Other Ambulatory Visit: Payer: Self-pay | Admitting: Obstetrics and Gynecology

## 2016-08-20 DIAGNOSIS — Z1231 Encounter for screening mammogram for malignant neoplasm of breast: Secondary | ICD-10-CM

## 2016-09-23 ENCOUNTER — Ambulatory Visit
Admission: RE | Admit: 2016-09-23 | Discharge: 2016-09-23 | Disposition: A | Discharge status: Home / Self Care | Payer: Medicare Other | Source: Ambulatory Visit | Attending: Obstetrics and Gynecology | Admitting: Obstetrics and Gynecology

## 2016-09-23 DIAGNOSIS — Z1231 Encounter for screening mammogram for malignant neoplasm of breast: Secondary | ICD-10-CM

## 2016-10-16 DIAGNOSIS — I10 Essential (primary) hypertension: Secondary | ICD-10-CM | POA: Diagnosis not present

## 2016-10-16 DIAGNOSIS — D869 Sarcoidosis, unspecified: Secondary | ICD-10-CM | POA: Diagnosis not present

## 2016-10-16 DIAGNOSIS — Z1389 Encounter for screening for other disorder: Secondary | ICD-10-CM | POA: Diagnosis not present

## 2016-10-16 DIAGNOSIS — I7300 Raynaud's syndrome without gangrene: Secondary | ICD-10-CM | POA: Diagnosis not present

## 2016-11-04 ENCOUNTER — Emergency Department (HOSPITAL_COMMUNITY)
Admission: EM | Admit: 2016-11-04 | Discharge: 2016-11-04 | Disposition: A | Discharge status: Home / Self Care | Payer: Medicare Other | Attending: Emergency Medicine | Admitting: Emergency Medicine

## 2016-11-04 ENCOUNTER — Emergency Department (HOSPITAL_COMMUNITY): Payer: Medicare Other

## 2016-11-04 ENCOUNTER — Encounter (HOSPITAL_COMMUNITY): Payer: Self-pay | Admitting: Emergency Medicine

## 2016-11-04 DIAGNOSIS — Z7982 Long term (current) use of aspirin: Secondary | ICD-10-CM | POA: Diagnosis not present

## 2016-11-04 DIAGNOSIS — Z79899 Other long term (current) drug therapy: Secondary | ICD-10-CM | POA: Insufficient documentation

## 2016-11-04 DIAGNOSIS — R51 Headache: Secondary | ICD-10-CM | POA: Diagnosis not present

## 2016-11-04 DIAGNOSIS — R55 Syncope and collapse: Secondary | ICD-10-CM

## 2016-11-04 DIAGNOSIS — D649 Anemia, unspecified: Secondary | ICD-10-CM

## 2016-11-04 DIAGNOSIS — M542 Cervicalgia: Secondary | ICD-10-CM | POA: Diagnosis not present

## 2016-11-04 DIAGNOSIS — M545 Low back pain: Secondary | ICD-10-CM | POA: Diagnosis not present

## 2016-11-04 DIAGNOSIS — S3992XA Unspecified injury of lower back, initial encounter: Secondary | ICD-10-CM | POA: Diagnosis not present

## 2016-11-04 LAB — URINALYSIS, ROUTINE W REFLEX MICROSCOPIC
Bilirubin Urine: NEGATIVE
Glucose, UA: NEGATIVE mg/dL
Hgb urine dipstick: NEGATIVE
Ketones, ur: NEGATIVE mg/dL
Leukocytes, UA: NEGATIVE
NITRITE: NEGATIVE
PH: 5.5 (ref 5.0–8.0)
Protein, ur: NEGATIVE mg/dL

## 2016-11-04 LAB — BASIC METABOLIC PANEL
ANION GAP: 6 (ref 5–15)
BUN: 21 mg/dL — ABNORMAL HIGH (ref 6–20)
CHLORIDE: 101 mmol/L (ref 101–111)
CO2: 27 mmol/L (ref 22–32)
Calcium: 9.3 mg/dL (ref 8.9–10.3)
Creatinine, Ser: 0.91 mg/dL (ref 0.44–1.00)
GFR calc Af Amer: >60 mL/min (ref >60)
Glucose, Bld: 102 mg/dL — ABNORMAL HIGH (ref 65–99)
POTASSIUM: 4.4 mmol/L (ref 3.5–5.1)
Sodium: 134 mmol/L — ABNORMAL LOW (ref 135–145)

## 2016-11-04 LAB — CBC
HEMATOCRIT: 34.8 % — AB (ref 36.0–46.0)
HEMOGLOBIN: 11.4 g/dL — AB (ref 12.0–15.0)
MCH: 32.2 pg (ref 26.0–34.0)
MCHC: 32.8 g/dL (ref 30.0–36.0)
MCV: 98.3 fL (ref 78.0–100.0)
Platelets: 243 10*3/uL (ref 150–400)
RBC: 3.54 MIL/uL — ABNORMAL LOW (ref 3.87–5.11)
RDW: 13.3 % (ref 11.5–15.5)
WBC: 8.7 10*3/uL (ref 4.0–10.5)

## 2016-11-04 LAB — CBG MONITORING, ED: Glucose-Capillary: 68 mg/dL (ref 65–99)

## 2016-11-04 MED ORDER — ACETAMINOPHEN 500 MG PO TABS
1000.0000 mg | ORAL_TABLET | Freq: Once | ORAL | Status: AC
  Administered 2016-11-04: 1000 mg via ORAL
  Filled 2016-11-04: qty 2

## 2016-11-04 NOTE — ED Provider Notes (Signed)
[facility] DEPT Provider Note   CSN: [id number] Arrival date & time: 11/04/16  1549     History   Chief Complaint Chief Complaint  Patient presents with  . Loss of Consciousness    HPI Jeanne Chang is a 65 y.o. female.  HPI  Jeanne Chang is a 65 year old female, history of arthritis, prior history of sarcoidosis and a history of anemia requiring treatment in the past. She presents to the hospital after having a syncopal episode earlier today where she was standing at her closet door looking at close when she became lightheaded and fell backwards landing on the ground after striking her head. She complains of a headache, neck pain and lower back pain but was able to be helped off the ground by her husband and has been ambulating without difficulty. She denies a loss of bladder continence and there was no seizure activity according to the husband. He heard her fall, got out of bed and beside the bed found her on the ground awake alert and talking. She does not remember this. There was a brief period that she does not remember before she got back to her normal self. Since that time she has not had any nausea vomiting or seizure-like activity. She denies any weakness or numbness of her arms or legs and complains primarily of pain in her upper buttock, lower back, the back of her head and her neck.  Past Medical History:  Diagnosis Date  . Arthritis   . History of adenomatous polyp of colon   . History of gastroesophageal reflux (GERD)   . History of hiatal hernia   . Nocturia   . Pelvic prolapse   . Sarcoidosis of lung [facility])    dx 11/ 2005  . Vaginal vault prolapse     Patient Active Problem List   Diagnosis Date Noted  . Prolapse of female pelvic organs 01/16/2015  . SARCOIDOSIS 09/06/2009  . GASTROESOPHAGEAL REFLUX DISEASE 09/06/2009  . CHEST DISCOMFORT 09/06/2009    Past Surgical History:  Procedure Laterality Date  . ANTERIOR AND POSTERIOR REPAIR N/A 01/16/2015   Procedure: ANTERIOR VAULT REPIAR WITH KELLY PLICATION AND APICAL SACROSPINUS SUSPENSION WITH COLOPLAST AUGMENTATION/ CYSTOSCOPY COLD CUP BLADDER BIOPSY AND CAUTERIZATION AT THE BLADDER DOME;  Surgeon: Ailene Rud, MD;  Location: [facility];  Service: Urology;  Laterality: N/A;  . CHOLECYSTECTOMY  age 63  . COLONOSCOPY  11/13/2011   Procedure: COLONOSCOPY;  Surgeon: Rogene Houston, MD;  Location: [facility];  Service: Endoscopy;  Laterality: N/A;  8:30 am  . COLONOSCOPY WITH ESOPHAGOGASTRODUODENOSCOPY (EGD) AND ESOPHAGEAL DILATION (ED)  08-19-2005   and polypectomy  . COMBINED MEDIASTINOSCOPY AND BRONCHOSCOPY  10-19-2004  dr Arlyce Dice   bx right middle lobe lesion (Epitheloid Granulomata) and left lower lobe (Hilar Adenopathy)  . TONSILLECTOMY  age 11  . TRANSTHORACIC ECHOCARDIOGRAM  09-11-2009   mild LVH/  ef [id number] grade I diastolic dysfunction/  mild MR/  mild LAE/  trivial TR  . VAGINAL HYSTERECTOMY  1987    OB History    Gravida Para Term Preterm AB Living   1 1 1      0   SAB TAB Ectopic Multiple Live Births                   Home Medications    Prior to Admission medications   Medication Sig Start Date End Date Taking? Authorizing Provider  acetaminophen (TYLENOL) 500 MG tablet Take 500 mg by mouth  every 4 (four) hours as needed for mild pain or moderate pain.   Yes Historical Provider, MD  ALPRAZolam Duanne Moron) 0.5 MG tablet Take 0.5 mg by mouth at bedtime as needed for anxiety.   Yes Historical Provider, MD  aspirin EC 81 MG tablet Take 81 mg by mouth daily.   Yes Historical Provider, MD  Cholecalciferol (VITAMIN D PO) Take 1 capsule by mouth daily.   Yes Historical Provider, MD    Family History Family History  Problem Relation Age of Onset  . Diabetes Mother   . Heart attack Mother   . Hypertension Mother   . Heart attack Father   . Osteoarthritis Father     Social History Social History  Substance Use Topics  . Smoking status: Never  Smoker  . Smokeless tobacco: Never Used  . Alcohol use No     Allergies   Tetanus toxoids   Review of Systems Review of Systems  All other systems reviewed and are negative.    Physical Exam Updated Vital Signs BP 120/70   Pulse (!) 54   Temp 97.6 F (36.4 C) (Oral)   Resp 18   Ht 5\' 2"  (1.575 m)   Wt 138 lb (62.6 kg)   SpO2 100%   BMI 25.24 kg/m   Physical Exam  Constitutional: She appears well-developed and well-nourished. No distress.  HENT:  Head: Normocephalic and atraumatic.  Mouth/Throat: Oropharynx is clear and moist. No oropharyngeal exudate.  Eyes: Conjunctivae and EOM are normal. Pupils are equal, round, and reactive to light. Right eye exhibits no discharge. Left eye exhibits no discharge. No scleral icterus.  Neck: Normal range of motion. Neck supple. No JVD present. No thyromegaly present.  Cardiovascular: Normal rate, regular rhythm, normal heart sounds and intact distal pulses.  Exam reveals no gallop and no friction rub.   No murmur heard. Pulmonary/Chest: Effort normal and breath sounds normal. No respiratory distress. She has no wheezes. She has no rales.  Abdominal: Soft. Bowel sounds are normal. She exhibits no distension and no mass. There is no tenderness.  Musculoskeletal: Normal range of motion. She exhibits tenderness ( Tender to palpation over the back of the cranium, the posterior cervical spine as well as the lower back in the midline). She exhibits no edema.  Joints are diffusely supple, compartments are diffusely soft, she is able to straight leg raise bilaterally without any pain  Lymphadenopathy:    She has no cervical adenopathy.  Neurological: She is alert. Coordination normal.  Speech is clear, cranial nerves III through XII are intact, memory is intact, strength is normal in all 4 extremities including grips, sensation is intact to light touch and pinprick in all 4 extremities. Coordination as tested by finger-nose-finger is normal, no  limb ataxia. Normal gait, normal reflexes at the patellar tendons bilaterally  Skin: Skin is warm and dry. No rash noted. No erythema.  Psychiatric: She has a normal mood and affect. Her behavior is normal.  Nursing note and vitals reviewed.    ED Treatments / Results  Labs (all labs ordered are listed, but only abnormal results are displayed) Labs Reviewed  BASIC METABOLIC PANEL - Abnormal; Notable for the following:       Result Value   Sodium 134 (*)    Glucose, Bld 102 (*)    BUN 21 (*)    All other components within normal limits  CBC - Abnormal; Notable for the following:    RBC 3.54 (*)    Hemoglobin 11.4 (*)  HCT 34.8 (*)    All other components within normal limits  URINALYSIS, ROUTINE W REFLEX MICROSCOPIC (NOT AT [facility]) - Abnormal; Notable for the following:    Specific Gravity, Urine <1.005 (*)    All other components within normal limits  CBG MONITORING, ED    EKG  EKG Interpretation  Date/Time:  Monday November 04 2016 16:09:03 EST Ventricular Rate:  54 PR Interval:  126 QRS Duration: 82 QT Interval:  430 QTC Calculation: 407 R Axis:   48 Text Interpretation:  Sinus bradycardia Right atrial enlargement Borderline ECG Since last tracing rate slower Confirmed by Kelicia Youtz  MD, Domanic Matusek ([id number]) on 11/04/2016 5:12:36 PM       Radiology Dg Lumbar Spine Complete  Result Date: 11/04/2016 CLINICAL DATA:  Lost consciousness, fell at home tonight. Mid-low back pain radiating to buttocks. EXAM: LUMBAR SPINE - COMPLETE 4+ VIEW COMPARISON:  CT abdomen and pelvis December 07, 2014 FINDINGS: Five non rib-bearing lumbar-type vertebral bodies are intact and aligned with maintenance of the lumbar lordosis. Intervertebral disc heights are maintained with multilevel mild endplate spurring. Scattered Schmorl's nodes. No destructive bony lesions. No pars interarticularis defects. Sacroiliac joints are symmetric. Included prevertebral and paraspinal soft tissue planes are  non-suspicious. Mild vascular calcifications. IMPRESSION: No acute fracture deformity or malalignment. Electronically Signed   By: Elon Alas M.D.   On: 11/04/2016 18:16   Ct Head Wo Contrast  Result Date: 11/04/2016 CLINICAL DATA:  Acute syncopal episode. Headache and back pain currently. EXAM: CT HEAD WITHOUT CONTRAST CT CERVICAL SPINE WITHOUT CONTRAST TECHNIQUE: Multidetector CT imaging of the head and cervical spine was performed following the standard protocol without intravenous contrast. Multiplanar CT image reconstructions of the cervical spine were also generated. COMPARISON:  None. FINDINGS: CT HEAD FINDINGS Brain: Ventricles, cisterns and other CSF spaces are within normal. There is no mass, mass effect, shift of midline structures or acute hemorrhage. There is no evidence of acute infarction. Vascular: Calcified plaque over the cavernous segment of the internal carotid arteries. Skull: Within normal. Sinuses/Orbits: Within normal. CT CERVICAL SPINE FINDINGS Alignment: Alignment within normal. Skull base and vertebrae: Mild spondylosis throughout the cervical spine. Mild uncovertebral joint spurring. Mild bilateral neural foraminal narrowing at several levels of the lower cervical spine. No acute fracture. Soft tissues and spinal canal: Within normal. Disc levels: Mild disc space narrowing from the C3-4 level to the C6-7 level. Upper chest: Within normal. IMPRESSION: No acute intracranial findings. No acute cervical spine injury. Mild spondylosis of the cervical spine with multilevel disc disease and multilevel neural foraminal narrowing as described. Electronically Signed   By: Marin Olp M.D.   On: 11/04/2016 19:18   Ct Cervical Spine Wo Contrast  Result Date: 11/04/2016 CLINICAL DATA:  Acute syncopal episode. Headache and back pain currently. EXAM: CT HEAD WITHOUT CONTRAST CT CERVICAL SPINE WITHOUT CONTRAST TECHNIQUE: Multidetector CT imaging of the head and cervical spine was  performed following the standard protocol without intravenous contrast. Multiplanar CT image reconstructions of the cervical spine were also generated. COMPARISON:  None. FINDINGS: CT HEAD FINDINGS Brain: Ventricles, cisterns and other CSF spaces are within normal. There is no mass, mass effect, shift of midline structures or acute hemorrhage. There is no evidence of acute infarction. Vascular: Calcified plaque over the cavernous segment of the internal carotid arteries. Skull: Within normal. Sinuses/Orbits: Within normal. CT CERVICAL SPINE FINDINGS Alignment: Alignment within normal. Skull base and vertebrae: Mild spondylosis throughout the cervical spine. Mild uncovertebral joint spurring. Mild bilateral neural foraminal narrowing  at several levels of the lower cervical spine. No acute fracture. Soft tissues and spinal canal: Within normal. Disc levels: Mild disc space narrowing from the C3-4 level to the C6-7 level. Upper chest: Within normal. IMPRESSION: No acute intracranial findings. No acute cervical spine injury. Mild spondylosis of the cervical spine with multilevel disc disease and multilevel neural foraminal narrowing as described. Electronically Signed   By: Marin Olp M.D.   On: 11/04/2016 19:18    Procedures Procedures (including critical care time)  Medications Ordered in ED Medications  acetaminophen (TYLENOL) tablet 1,000 mg (1,000 mg Oral Given 11/04/16 1846)     Initial Impression / Assessment and Plan / ED Course  I have reviewed the triage vital signs and the nursing notes.  Pertinent labs & imaging results that were available during my care of the patient were reviewed by me and considered in my medical decision making (see chart for details).  Clinical Course     The patient denies any prior palpitations or focal neurologic deficits prior to the syncopal episode. She states this occurred just after standing up from a laying position on the bed. I suspect that this was  more related to positional changes than anything else. She has had a normal appetite, denies diarrhea or rectal bleeding or any other complaints. She will need an evaluation of her cervical spine, her brain and her lower back by imaging. She request Tylenol for pain, she has a normal neurologic exam and appears comfortable.  ECG and labs unremarkable - given results including Hgb, encouraged to have close f/u - pt appears well at this time, no change in mental status, no frx or intracranial findings on xray.  Final Clinical Impressions(s) / ED Diagnoses   Final diagnoses:  Syncope, unspecified syncope type  Mild anemia    New Prescriptions New Prescriptions   No medications on file     Noemi Chapel, MD 11/04/16 1954

## 2016-11-04 NOTE — ED Triage Notes (Signed)
Pt states she bent over in the closet to get something and had sudden LOC. Pt denies weakness. Pt states she felt a little lightheaded. Pt reports headache and back pain at present. No slurred speech. No facial asymetry

## 2016-11-04 NOTE — Discharge Instructions (Signed)
Tylenol for pain No broken bones or brain injury Drink plenty of fluids Bedrest for 24 hours Return to ER if worsens

## 2016-11-06 ENCOUNTER — Encounter (INDEPENDENT_AMBULATORY_CARE_PROVIDER_SITE_OTHER): Payer: Self-pay | Admitting: *Deleted

## 2016-11-12 DIAGNOSIS — R55 Syncope and collapse: Secondary | ICD-10-CM | POA: Diagnosis not present

## 2016-11-12 DIAGNOSIS — D649 Anemia, unspecified: Secondary | ICD-10-CM | POA: Diagnosis not present

## 2016-11-12 DIAGNOSIS — I358 Other nonrheumatic aortic valve disorders: Secondary | ICD-10-CM | POA: Diagnosis not present

## 2016-11-12 DIAGNOSIS — Z1389 Encounter for screening for other disorder: Secondary | ICD-10-CM | POA: Diagnosis not present

## 2016-11-12 DIAGNOSIS — Z0001 Encounter for general adult medical examination with abnormal findings: Secondary | ICD-10-CM | POA: Diagnosis not present

## 2016-11-12 DIAGNOSIS — I10 Essential (primary) hypertension: Secondary | ICD-10-CM | POA: Diagnosis not present

## 2016-11-12 DIAGNOSIS — K219 Gastro-esophageal reflux disease without esophagitis: Secondary | ICD-10-CM | POA: Diagnosis not present

## 2016-11-12 DIAGNOSIS — B351 Tinea unguium: Secondary | ICD-10-CM | POA: Diagnosis not present

## 2016-11-20 DIAGNOSIS — Z01419 Encounter for gynecological examination (general) (routine) without abnormal findings: Secondary | ICD-10-CM | POA: Diagnosis not present

## 2017-01-01 ENCOUNTER — Ambulatory Visit: Payer: Medicare Other | Admitting: Cardiovascular Disease

## 2017-01-01 ENCOUNTER — Other Ambulatory Visit (INDEPENDENT_AMBULATORY_CARE_PROVIDER_SITE_OTHER): Payer: Self-pay | Admitting: *Deleted

## 2017-01-01 DIAGNOSIS — Z86010 Personal history of colonic polyps: Secondary | ICD-10-CM

## 2017-01-01 DIAGNOSIS — Z800 Family history of malignant neoplasm of digestive organs: Secondary | ICD-10-CM | POA: Insufficient documentation

## 2017-01-10 ENCOUNTER — Encounter (INDEPENDENT_AMBULATORY_CARE_PROVIDER_SITE_OTHER): Payer: Self-pay | Admitting: *Deleted

## 2017-01-10 ENCOUNTER — Telehealth (INDEPENDENT_AMBULATORY_CARE_PROVIDER_SITE_OTHER): Payer: Self-pay | Admitting: *Deleted

## 2017-01-10 NOTE — Telephone Encounter (Signed)
Patient needs trilyte 

## 2017-01-13 MED ORDER — PEG 3350-KCL-NA BICARB-NACL 420 G PO SOLR: 4000.0000 mL | Freq: Once | ORAL | 0 refills | Status: AC

## 2017-01-31 ENCOUNTER — Telehealth (INDEPENDENT_AMBULATORY_CARE_PROVIDER_SITE_OTHER): Payer: Self-pay | Admitting: *Deleted

## 2017-01-31 NOTE — Telephone Encounter (Signed)
Referring MD/PCP: fusco   Procedure: tcs  Reason/Indication:  Hx polyps, fa, hx colon ca  Has patient had this procedure before?  Yes, 2012  If so, when, by whom and where?    Is there a family history of colon cancer?  no  Who?  What age when diagnosed?    Is patient diabetic?   no      Does patient have prosthetic heart valve or mechanical valve?  no  Do you have a pacemaker?  no  Has patient ever had endocarditis? no  Has patient had joint replacement within last 12 months?  no  Does patient tend to be constipated or take laxatives? no  Does patient have a history of alcohol/drug use?  no  Is patient on Coumadin, Plavix and/or Aspirin? yes  Medications: asa prn, xanax 0.5 mg prn  Allergies: tetanus  Medication Adjustment per Dr Laural Golden: asa 2 days  Procedure date & time: 02/26/17 at 125

## 2017-02-03 NOTE — Telephone Encounter (Signed)
agree

## 2017-02-26 ENCOUNTER — Encounter (HOSPITAL_COMMUNITY): Payer: Self-pay | Admitting: *Deleted

## 2017-02-26 ENCOUNTER — Ambulatory Visit (HOSPITAL_COMMUNITY)
Admission: RE | Admit: 2017-02-26 | Discharge: 2017-02-26 | Disposition: A | Discharge status: Home / Self Care | Payer: Medicare Other | Source: Ambulatory Visit | Attending: Internal Medicine | Admitting: Internal Medicine

## 2017-02-26 ENCOUNTER — Encounter (HOSPITAL_COMMUNITY)
Admission: RE | Disposition: A | Discharge status: Home / Self Care | Payer: Self-pay | Source: Ambulatory Visit | Attending: Internal Medicine

## 2017-02-26 DIAGNOSIS — K5730 Diverticulosis of large intestine without perforation or abscess without bleeding: Secondary | ICD-10-CM | POA: Diagnosis not present

## 2017-02-26 DIAGNOSIS — Z1211 Encounter for screening for malignant neoplasm of colon: Secondary | ICD-10-CM | POA: Diagnosis not present

## 2017-02-26 DIAGNOSIS — K219 Gastro-esophageal reflux disease without esophagitis: Secondary | ICD-10-CM | POA: Diagnosis not present

## 2017-02-26 DIAGNOSIS — Z860100 Personal history of colon polyps, unspecified: Secondary | ICD-10-CM | POA: Insufficient documentation

## 2017-02-26 DIAGNOSIS — Z800 Family history of malignant neoplasm of digestive organs: Secondary | ICD-10-CM | POA: Diagnosis not present

## 2017-02-26 DIAGNOSIS — Z79899 Other long term (current) drug therapy: Secondary | ICD-10-CM | POA: Insufficient documentation

## 2017-02-26 DIAGNOSIS — Z09 Encounter for follow-up examination after completed treatment for conditions other than malignant neoplasm: Secondary | ICD-10-CM | POA: Diagnosis not present

## 2017-02-26 DIAGNOSIS — Z7982 Long term (current) use of aspirin: Secondary | ICD-10-CM | POA: Diagnosis not present

## 2017-02-26 DIAGNOSIS — M1990 Unspecified osteoarthritis, unspecified site: Secondary | ICD-10-CM | POA: Diagnosis not present

## 2017-02-26 HISTORY — PX: COLONOSCOPY: SHX5424

## 2017-02-26 SURGERY — COLONOSCOPY
Anesthesia: Moderate Sedation

## 2017-02-26 MED ORDER — MIDAZOLAM HCL 5 MG/5ML IJ SOLN
INTRAMUSCULAR | Status: DC | PRN
  Administered 2017-02-26: 2 mg via INTRAVENOUS
  Administered 2017-02-26 (×2): 1 mg via INTRAVENOUS
  Administered 2017-02-26: 2 mg via INTRAVENOUS
  Administered 2017-02-26: 1 mg via INTRAVENOUS

## 2017-02-26 MED ORDER — SODIUM CHLORIDE 0.9 % IV SOLN
INTRAVENOUS | Status: DC
  Administered 2017-02-26: 13:00:00 via INTRAVENOUS

## 2017-02-26 MED ORDER — MEPERIDINE HCL 50 MG/ML IJ SOLN
INTRAMUSCULAR | Status: DC
Start: 2017-02-26 — End: 2017-02-26
  Filled 2017-02-26: qty 1

## 2017-02-26 MED ORDER — MIDAZOLAM HCL 5 MG/5ML IJ SOLN
INTRAMUSCULAR | Status: AC
  Filled 2017-02-26: qty 10

## 2017-02-26 MED ORDER — STERILE WATER FOR IRRIGATION IR SOLN
Status: DC | PRN
  Administered 2017-02-26: 13:00:00

## 2017-02-26 MED ORDER — MEPERIDINE HCL 50 MG/ML IJ SOLN
INTRAMUSCULAR | Status: DC | PRN
  Administered 2017-02-26 (×2): 25 mg via INTRAVENOUS

## 2017-02-26 NOTE — Discharge Instructions (Signed)
High-Fiber Diet Fiber, also called dietary fiber, is a type of carbohydrate found in fruits, vegetables, whole grains, and beans. A high-fiber diet can have many health benefits. Your health care provider may recommend a high-fiber diet to help: Prevent constipation. Fiber can make your bowel movements more regular. Lower your cholesterol. Relieve hemorrhoids, uncomplicated diverticulosis, or irritable bowel syndrome. Prevent overeating as part of a weight-loss plan. Prevent heart disease, type 2 diabetes, and certain cancers. What is my plan? The recommended daily intake of fiber includes: 38 grams for men under age 74. 61 grams for men over age 39. 57 grams for women under age 66. 86 grams for women over age 61. You can get the recommended daily intake of dietary fiber by eating a variety of fruits, vegetables, grains, and beans. Your health care provider may also recommend a fiber supplement if it is not possible to get enough fiber through your diet. What do I need to know about a high-fiber diet? Fiber supplements have not been widely studied for their effectiveness, so it is better to get fiber through food sources. Always check the fiber content on thenutrition facts label of any prepackaged food. Look for foods that contain at least 5 grams of fiber per serving. Ask your dietitian if you have questions about specific foods that are related to your condition, especially if those foods are not listed in the following section. Increase your daily fiber consumption gradually. Increasing your intake of dietary fiber too quickly may cause bloating, cramping, or gas. Drink plenty of water. Water helps you to digest fiber. What foods can I eat? Grains  Whole-grain breads. Multigrain cereal. Oats and oatmeal. Brown rice. Barley. Bulgur wheat. [place]. Bran muffins. Popcorn. Rye wafer crackers. Vegetables  Sweet potatoes. Spinach. Kale. Artichokes. Cabbage. Broccoli. Green peas. Carrots.  Squash. Fruits  Berries. Pears. Apples. Oranges. Avocados. Prunes and raisins. Dried figs. Meats and Other Protein Sources  [place], kidney, pinto, and soy beans. Split peas. Lentils. Nuts and seeds. Dairy  Fiber-fortified yogurt. Beverages  Fiber-fortified soy milk. Fiber-fortified orange juice. Other  Fiber bars. The items listed above may not be a complete list of recommended foods or beverages. Contact your dietitian for more options.  What foods are not recommended? Grains  White bread. Pasta made with refined flour. White rice. Vegetables  Fried potatoes. Canned vegetables. Well-cooked vegetables. Fruits  Fruit juice. Cooked, strained fruit. Meats and Other Protein Sources  Fatty cuts of meat. Fried Sales executive or fried fish. Dairy  Milk. Yogurt. Cream cheese. Sour cream. Beverages  Soft drinks. Other  Cakes and pastries. Butter and oils. The items listed above may not be a complete list of foods and beverages to avoid. Contact your dietitian for more information.  What are some tips for including high-fiber foods in my diet? Eat a wide variety of high-fiber foods. Make sure that half of all grains consumed each day are whole grains. Replace breads and cereals made from refined flour or white flour with whole-grain breads and cereals. Replace white rice with brown rice, bulgur wheat, or millet. Start the day with a breakfast that is high in fiber, such as a cereal that contains at least 5 grams of fiber per serving. Use beans in place of meat in soups, salads, or pasta. Eat high-fiber snacks, such as berries, raw vegetables, nuts, or popcorn. This information is not intended to replace advice given to you by your health care provider. Make sure you discuss any questions you have with your health care  provider. Document Released: 11/25/2005 Document Revised: 05/02/2016 Document Reviewed: 05/10/2014 Elsevier Interactive Patient Education  2017 [place]. Colonoscopy, Adult,  Care After This sheet gives you information about how to care for yourself after your procedure. Your doctor may also give you more specific instructions. If you have problems or questions, call your doctor. Follow these instructions at home: General instructions    For the first 24 hours after the procedure:  Do not drive or use machinery.  Do not sign important documents.  Do not drink alcohol.  Do your daily activities more slowly than normal.  Eat foods that are soft and easy to digest.  Rest often.  Take over-the-counter or prescription medicines only as told by your doctor.  It is up to you to get the results of your procedure. Ask your doctor, or the department performing the procedure, when your results will be ready. To help cramping and bloating:   Try walking around.  Put heat on your belly (abdomen) as told by your doctor. Use a heat source that your doctor recommends, such as a moist heat pack or a heating pad.  Put a towel between your skin and the heat source.  Leave the heat on for 20-30 minutes.  Remove the heat if your skin turns bright red. This is especially important if you cannot feel pain, heat, or cold. You can get burned. Eating and drinking   Drink enough fluid to keep your pee (urine) clear or pale yellow.  Return to your normal diet as told by your doctor. Avoid heavy or fried foods that are hard to digest.  Avoid drinking alcohol for as long as told by your doctor. Contact a doctor if:  You have blood in your poop (stool) 2-3 days after the procedure. Get help right away if:  You have more than a small amount of blood in your poop.  You see large clumps of tissue (blood clots) in your poop.  Your belly is swollen.  You feel sick to your stomach (nauseous).  You throw up (vomit).  You have a fever.  You have belly pain that gets worse, and medicine does not help your pain. This information is not intended to replace advice given  to you by your health care provider. Make sure you discuss any questions you have with your health care provider. Document Released: 12/28/2010 Document Revised: 08/19/2016 Document Reviewed: 08/19/2016 Elsevier Interactive Patient Education  2017 [employer] Colonoscopy, Adult, Care After This sheet gives you information about how to care for yourself after your procedure. Your doctor may also give you more specific instructions. If you have problems or questions, call your doctor. Follow these instructions at home: General instructions    For the first 24 hours after the procedure:  Do not drive or use machinery.  Do not sign important documents.  Do not drink alcohol.  Do your daily activities more slowly than normal.  Eat foods that are soft and easy to digest.  Rest often.  Take over-the-counter or prescription medicines only as told by your doctor.  It is up to you to get the results of your procedure. Ask your doctor, or the department performing the procedure, when your results will be ready. To help cramping and bloating:   Try walking around.  Put heat on your belly (abdomen) as told by your doctor. Use a heat source that your doctor recommends, such as a moist heat pack or a heating pad.  Put a towel between your  skin and the heat source.  Leave the heat on for 20-30 minutes.  Remove the heat if your skin turns bright red. This is especially important if you cannot feel pain, heat, or cold. You can get burned. Eating and drinking   Drink enough fluid to keep your pee (urine) clear or pale yellow.  Return to your normal diet as told by your doctor. Avoid heavy or fried foods that are hard to digest.  Avoid drinking alcohol for as long as told by your doctor. Contact a doctor if:  You have blood in your poop (stool) 2-3 days after the procedure. Get help right away if:  You have more than a small amount of blood in your poop.  You see large clumps of  tissue (blood clots) in your poop.  Your belly is swollen.  You feel sick to your stomach (nauseous).  You throw up (vomit).  You have a fever.  You have belly pain that gets worse, and medicine does not help your pain. This information is not intended to replace advice given to you by your health care provider. Make sure you discuss any questions you have with your health care provider. Document Released: 12/28/2010 Document Revised: 08/19/2016 Document Reviewed: 08/19/2016 Elsevier Interactive Patient Education  2017 [place] usual medications and high fiber diet. No driving for 24 hours. Next colonoscopy in 5 years.

## 2017-02-26 NOTE — H&P (Signed)
Jeanne Chang is an 66 y.o. female.   Chief Complaint: Patient is here for colonoscopy. HPI: Patient is 66 year old female with history of colonic adenomas and family history of CRC and is here for surveillance colonoscopy. She denies rectal bleeding or abdominal pain. She has chronic constipation. She tries to treat his condition with stool softer and high fiber diet. She has taken MiraLAX in the past. Family history significant for CRC in mother who was in her late 19s at the time of diagnosis and did well after surgery. She died at 2 of MI.  Past Medical History:  Diagnosis Date  . Arthritis   . GERD (gastroesophageal reflux disease)   . History of adenomatous polyp of colon               . Pelvic prolapse   . Sarcoidosis of lung [facility]    dx 11/ 2005  . Vaginal vault prolapse     Past Surgical History:  Procedure Laterality Date  . ANTERIOR AND POSTERIOR REPAIR N/A 01/16/2015   Procedure: ANTERIOR VAULT REPIAR WITH KELLY PLICATION AND APICAL SACROSPINUS SUSPENSION WITH COLOPLAST AUGMENTATION/ CYSTOSCOPY COLD CUP BLADDER BIOPSY AND CAUTERIZATION AT THE BLADDER DOME;  Surgeon: Ailene Rud, MD;  Location: [facility];  Service: Urology;  Laterality: N/A;  . CHOLECYSTECTOMY  age 41  . COLONOSCOPY  11/13/2011   Procedure: COLONOSCOPY;  Surgeon: Rogene Houston, MD;  [facility];  Service: Endoscopy;  Laterality: N/A;  8:30 am  . COLONOSCOPY WITH ESOPHAGOGASTRODUODENOSCOPY (EGD) AND ESOPHAGEAL DILATION (ED)  08-19-2005   and polypectomy  . COMBINED MEDIASTINOSCOPY AND BRONCHOSCOPY  10-19-2004  dr Arlyce Dice   bx right middle lobe lesion (Epitheloid Granulomata) and left lower lobe (Hilar Adenopathy)  . TONSILLECTOMY  age 64  . TRANSTHORACIC ECHOCARDIOGRAM  09-11-2009   mild LVH/  ef 40-08%/ grade I diastolic dysfunction/  mild MR/  mild LAE/  trivial TR  . VAGINAL HYSTERECTOMY  1987    Family History  Problem Relation Age of Onset  . Diabetes  Mother   . Heart attack Mother   . Hypertension Mother   . Heart attack Father   . Osteoarthritis Father    Social History:  reports that she has never smoked. She has never used smokeless tobacco. She reports that she does not drink alcohol or use drugs.  Allergies:  Allergies  Allergen Reactions  . Tetanus Toxoids Swelling    Medications Prior to Admission  Medication Sig Dispense Refill  . ALPRAZolam (XANAX) 0.5 MG tablet Take 0.5 mg by mouth at bedtime.     Marland Kitchen aspirin EC 81 MG tablet Take 81 mg by mouth 2 (two) times a week.     . cetirizine (ZYRTEC) 10 MG tablet Take 10 mg by mouth daily as needed for allergies.    . fluticasone (FLONASE) 50 MCG/ACT nasal spray Place 1 spray into both nostrils daily as needed for allergies or rhinitis.    . Multiple Vitamin (MULTIVITAMIN WITH MINERALS) TABS tablet Take 1 tablet by mouth 2 (two) times a week.      No results found for this or any previous visit (from the past 48 hour(s)). No results found.  ROS  Blood pressure (!) 126/54, pulse (!) 42, temperature 97.9 F (36.6 C), temperature source Oral, resp. rate 16, height 5\' 2"  (1.575 m), weight 140 lb (63.5 kg), SpO2 100 %. Physical Exam  Constitutional: She appears well-developed and well-nourished.  HENT:  Mouth/Throat: Oropharynx is clear and  moist.  Eyes: Conjunctivae are normal. No scleral icterus.  Neck: No thyromegaly present.  Cardiovascular: Normal rate and regular rhythm.  Gallop: faint systolic ejection murmur heard at left sternal border and aor area.   Murmur heard. Respiratory: Effort normal.  GI: Soft. She exhibits no distension and no mass. There is no tenderness.  Musculoskeletal: She exhibits no edema.  Lymphadenopathy:    She has no cervical adenopathy.  Neurological: She is alert.  Skin: Skin is warm and dry.     Assessment/Plan History of colonic adenomas. Family history of CRC in mother. Surveillance colonoscopy.  Hildred Laser, MD 02/26/2017, 12:51  PM

## 2017-02-26 NOTE — Op Note (Signed)
[facility] Patient Name: Jeanne Chang Procedure Date: 02/26/2017 12:48 PM MRN: [id number] Date of Birth: October 03, 1951 Attending MD: Hildred Laser , MD CSN: [id number] Age: 66 Admit Type: Outpatient Procedure:                Colonoscopy Indications:              High risk colon cancer surveillance: Personal                            history of colonic polyps, Family history of colon                            cancer in a first-degree relative Providers:                Hildred Laser, MD, Otis Peak B. Sharon Seller, RN, Sherlyn Lees, Technician Referring MD:             Redmond School, MD Medicines:                Meperidine 50 mg IV, Midazolam 7 mg IV Complications:            No immediate complications. Estimated Blood Loss:     Estimated blood loss: none. Procedure:                Pre-Anesthesia Assessment:                           - Prior to the procedure, a History and Physical                            was performed, and patient medications and                            allergies were reviewed. The patient's tolerance of                            previous anesthesia was also reviewed. The risks                            and benefits of the procedure and the sedation                            options and risks were discussed with the patient.                            All questions were answered, and informed consent                            was obtained. Prior Anticoagulants: The patient                            last took aspirin 3 days prior to the procedure.  ASA Grade Assessment: II - A patient with mild                            systemic disease. After reviewing the risks and                            benefits, the patient was deemed in satisfactory                            condition to undergo the procedure.                           After obtaining informed consent, the colonoscope    was passed under direct vision. Throughout the                            procedure, the patient's blood pressure, pulse, and                            oxygen saturations were monitored continuously. The                            [id number] ([id number]) scope was introduced through                            the anus and advanced to the the cecum, identified                            by appendiceal orifice and ileocecal valve. The                            colonoscopy was somewhat difficult due to a                            tortuous colon. Successful completion of the                            procedure was aided by increasing the dose of                            sedation medication, changing the patient to a                            supine position, using manual pressure and                            withdrawing and reinserting the scope. The patient                            tolerated the procedure well. The quality of the                            bowel preparation was good. The ileocecal valve,  appendiceal orifice, and rectum were photographed. Scope In: 1:04:14 PM Scope Out: 1:29:45 PM Scope Withdrawal Time: 0 hours 9 minutes 40 seconds  Total Procedure Duration: 0 hours 25 minutes 31 seconds  Findings:      The perianal and digital rectal examinations were normal.      A few small-mouthed diverticula were found in the sigmoid colon.      The exam was otherwise normal throughout the examined colon.      The retroflexed view of the distal rectum and anal verge was normal and       showed no anal or rectal abnormalities. Impression:               - Diverticulosis in the sigmoid colon.                           - No specimens collected. Moderate Sedation:      Moderate (conscious) sedation was administered by the endoscopy nurse       and supervised by the endoscopist. The following parameters were       monitored: oxygen saturation, heart rate,  blood pressure, CO2       capnography and response to care. Total physician intraservice time was       31 minutes. Recommendation:           - Patient has a contact number available for                            emergencies. The signs and symptoms of potential                            delayed complications were discussed with the                            patient. Return to normal activities tomorrow.                            Written discharge instructions were provided to the                            patient.                           - High fiber diet today.                           - Resume previous diet.                           - Continue present medications.                           - Resume aspirin at prior dose today.                           - Repeat colonoscopy in 5 years for surveillance. Procedure Code(s):        --- Professional ---  [phone], Colonoscopy, flexible; diagnostic, including                            collection of specimen(s) by brushing or washing,                            when performed (separate procedure)                           99152, Moderate sedation services provided by the                            same physician or other qualified health care                            professional performing the diagnostic or                            therapeutic service that the sedation supports,                            requiring the presence of an independent trained                            observer to assist in the monitoring of the                            patient's level of consciousness and physiological                            status; initial 15 minutes of intraservice time,                            patient age 46 years or older                           [phone], Moderate sedation services; each additional                            15 minutes intraservice time Diagnosis Code(s):        --- Professional ---                            Z86.010, Personal history of colonic polyps                           Z80.0, Family history of malignant neoplasm of                            digestive organs                           K57.30, Diverticulosis of large intestine without  perforation or abscess without bleeding CPT copyright 2016 American [employer]. All rights reserved. The codes documented in this report are preliminary and upon coder review may  be revised to meet current compliance requirements. Hildred Laser, MD Hildred Laser, MD 02/26/2017 1:42:08 PM This report has been signed electronically. Number of Addenda: 0

## 2017-03-03 ENCOUNTER — Encounter (HOSPITAL_COMMUNITY): Payer: Self-pay | Admitting: Internal Medicine

## 2017-05-07 DIAGNOSIS — B351 Tinea unguium: Secondary | ICD-10-CM | POA: Diagnosis not present

## 2017-05-07 DIAGNOSIS — Z1389 Encounter for screening for other disorder: Secondary | ICD-10-CM | POA: Diagnosis not present

## 2017-08-20 ENCOUNTER — Other Ambulatory Visit: Payer: Self-pay | Admitting: Obstetrics and Gynecology

## 2017-08-20 DIAGNOSIS — Z1231 Encounter for screening mammogram for malignant neoplasm of breast: Secondary | ICD-10-CM

## 2017-09-29 ENCOUNTER — Ambulatory Visit
Admission: RE | Admit: 2017-09-29 | Discharge: 2017-09-29 | Disposition: A | Discharge status: Home / Self Care | Payer: Medicare Other | Source: Ambulatory Visit | Attending: Obstetrics and Gynecology | Admitting: Obstetrics and Gynecology

## 2017-09-29 DIAGNOSIS — Z1231 Encounter for screening mammogram for malignant neoplasm of breast: Secondary | ICD-10-CM

## 2017-11-10 DIAGNOSIS — Z1389 Encounter for screening for other disorder: Secondary | ICD-10-CM | POA: Diagnosis not present

## 2017-11-10 DIAGNOSIS — F5101 Primary insomnia: Secondary | ICD-10-CM | POA: Diagnosis not present

## 2017-11-10 DIAGNOSIS — Z23 Encounter for immunization: Secondary | ICD-10-CM | POA: Diagnosis not present

## 2017-11-24 DIAGNOSIS — Z01419 Encounter for gynecological examination (general) (routine) without abnormal findings: Secondary | ICD-10-CM | POA: Diagnosis not present

## 2017-11-24 DIAGNOSIS — Z0000 Encounter for general adult medical examination without abnormal findings: Secondary | ICD-10-CM | POA: Diagnosis not present

## 2017-11-25 DIAGNOSIS — Z0000 Encounter for general adult medical examination without abnormal findings: Secondary | ICD-10-CM | POA: Diagnosis not present

## 2017-11-25 DIAGNOSIS — Z1389 Encounter for screening for other disorder: Secondary | ICD-10-CM | POA: Diagnosis not present

## 2017-11-25 DIAGNOSIS — E782 Mixed hyperlipidemia: Secondary | ICD-10-CM | POA: Diagnosis not present

## 2018-01-22 DIAGNOSIS — G894 Chronic pain syndrome: Secondary | ICD-10-CM | POA: Diagnosis not present

## 2018-01-22 DIAGNOSIS — K219 Gastro-esophageal reflux disease without esophagitis: Secondary | ICD-10-CM | POA: Diagnosis not present

## 2018-01-22 DIAGNOSIS — I10 Essential (primary) hypertension: Secondary | ICD-10-CM | POA: Diagnosis not present

## 2018-05-05 DIAGNOSIS — E782 Mixed hyperlipidemia: Secondary | ICD-10-CM | POA: Diagnosis not present

## 2018-05-05 DIAGNOSIS — Z1389 Encounter for screening for other disorder: Secondary | ICD-10-CM | POA: Diagnosis not present

## 2018-05-05 DIAGNOSIS — E785 Hyperlipidemia, unspecified: Secondary | ICD-10-CM | POA: Diagnosis not present

## 2018-05-05 DIAGNOSIS — I10 Essential (primary) hypertension: Secondary | ICD-10-CM | POA: Diagnosis not present

## 2018-05-05 DIAGNOSIS — D869 Sarcoidosis, unspecified: Secondary | ICD-10-CM | POA: Diagnosis not present

## 2018-08-18 DIAGNOSIS — E782 Mixed hyperlipidemia: Secondary | ICD-10-CM | POA: Diagnosis not present

## 2018-08-18 DIAGNOSIS — G894 Chronic pain syndrome: Secondary | ICD-10-CM | POA: Diagnosis not present

## 2018-08-18 DIAGNOSIS — Z1389 Encounter for screening for other disorder: Secondary | ICD-10-CM | POA: Diagnosis not present

## 2018-08-18 DIAGNOSIS — Z0001 Encounter for general adult medical examination with abnormal findings: Secondary | ICD-10-CM | POA: Diagnosis not present

## 2018-08-18 DIAGNOSIS — M545 Low back pain: Secondary | ICD-10-CM | POA: Diagnosis not present

## 2018-08-18 DIAGNOSIS — Z23 Encounter for immunization: Secondary | ICD-10-CM | POA: Diagnosis not present

## 2018-08-24 ENCOUNTER — Other Ambulatory Visit: Payer: Self-pay | Admitting: Obstetrics and Gynecology

## 2018-08-24 DIAGNOSIS — Z1231 Encounter for screening mammogram for malignant neoplasm of breast: Secondary | ICD-10-CM

## 2018-10-05 ENCOUNTER — Ambulatory Visit
Admission: RE | Admit: 2018-10-05 | Discharge: 2018-10-05 | Disposition: A | Discharge status: Home / Self Care | Payer: Medicare Other | Source: Ambulatory Visit | Attending: Obstetrics and Gynecology | Admitting: Obstetrics and Gynecology

## 2018-10-05 DIAGNOSIS — Z1231 Encounter for screening mammogram for malignant neoplasm of breast: Secondary | ICD-10-CM

## 2018-10-22 DIAGNOSIS — I10 Essential (primary) hypertension: Secondary | ICD-10-CM | POA: Diagnosis not present

## 2018-10-22 DIAGNOSIS — K219 Gastro-esophageal reflux disease without esophagitis: Secondary | ICD-10-CM | POA: Diagnosis not present

## 2018-10-22 DIAGNOSIS — G894 Chronic pain syndrome: Secondary | ICD-10-CM | POA: Diagnosis not present

## 2018-10-22 DIAGNOSIS — M5412 Radiculopathy, cervical region: Secondary | ICD-10-CM | POA: Diagnosis not present

## 2018-12-14 DIAGNOSIS — N958 Other specified menopausal and perimenopausal disorders: Secondary | ICD-10-CM | POA: Diagnosis not present

## 2018-12-14 DIAGNOSIS — M8588 Other specified disorders of bone density and structure, other site: Secondary | ICD-10-CM | POA: Diagnosis not present

## 2018-12-22 DIAGNOSIS — I10 Essential (primary) hypertension: Secondary | ICD-10-CM | POA: Diagnosis not present

## 2018-12-22 DIAGNOSIS — Z1389 Encounter for screening for other disorder: Secondary | ICD-10-CM | POA: Diagnosis not present

## 2018-12-22 DIAGNOSIS — Z0001 Encounter for general adult medical examination with abnormal findings: Secondary | ICD-10-CM | POA: Diagnosis not present

## 2018-12-22 DIAGNOSIS — G894 Chronic pain syndrome: Secondary | ICD-10-CM | POA: Diagnosis not present

## 2018-12-22 DIAGNOSIS — E782 Mixed hyperlipidemia: Secondary | ICD-10-CM | POA: Diagnosis not present

## 2018-12-23 DIAGNOSIS — Z0001 Encounter for general adult medical examination with abnormal findings: Secondary | ICD-10-CM | POA: Diagnosis not present

## 2018-12-23 DIAGNOSIS — Z1389 Encounter for screening for other disorder: Secondary | ICD-10-CM | POA: Diagnosis not present

## 2018-12-23 DIAGNOSIS — E7849 Other hyperlipidemia: Secondary | ICD-10-CM | POA: Diagnosis not present

## 2019-01-14 ENCOUNTER — Ambulatory Visit: Payer: Medicare Other | Admitting: Cardiology

## 2019-01-27 DIAGNOSIS — T50905A Adverse effect of unspecified drugs, medicaments and biological substances, initial encounter: Secondary | ICD-10-CM | POA: Diagnosis not present

## 2019-01-27 DIAGNOSIS — M791 Myalgia, unspecified site: Secondary | ICD-10-CM | POA: Diagnosis not present

## 2019-01-27 DIAGNOSIS — I10 Essential (primary) hypertension: Secondary | ICD-10-CM | POA: Diagnosis not present

## 2019-02-01 ENCOUNTER — Encounter: Payer: Self-pay | Admitting: Cardiology

## 2019-02-01 ENCOUNTER — Ambulatory Visit: Payer: Medicare Other | Admitting: Cardiology

## 2019-02-01 VITALS — BP 130/68 | HR 57 | Ht 62.0 in | Wt 144.0 lb

## 2019-02-01 DIAGNOSIS — Z862 Personal history of diseases of the blood and blood-forming organs and certain disorders involving the immune mechanism: Secondary | ICD-10-CM | POA: Insufficient documentation

## 2019-02-01 DIAGNOSIS — Z7189 Other specified counseling: Secondary | ICD-10-CM

## 2019-02-01 DIAGNOSIS — R011 Cardiac murmur, unspecified: Secondary | ICD-10-CM | POA: Diagnosis not present

## 2019-02-01 NOTE — Patient Instructions (Signed)
Medication Instructions:  Your Physician recommend you continue on your current medication as directed.    If you need a refill on your cardiac medications before your next appointment, please call your pharmacy.   Lab work: None  Testing/Procedures: None  Follow-Up: At [facility], you and your health needs are our priority.  As part of our continuing mission to provide you with exceptional heart care, we have created designated Provider Care Teams.  These Care Teams include your primary Cardiologist (physician) and Advanced Practice Providers (APPs -  Physician Assistants and Nurse Practitioners) who all work together to provide you with the care you need, when you need it. You will need a follow up appointment in 1 years.  Please call our office 2 months in advance to schedule this appointment.  You may see Dr. Susa Raring or one of the following Advanced Practice Providers on your designated Care Team:   Rosaria Ferries, PA-C . Jory Sims, DNP, ANP

## 2019-02-01 NOTE — Progress Notes (Signed)
Cardiology Office Note:    Date:  02/01/2019   ID:  Jeanne Chang, DOB 07-21-1951, MRN [id number]  PCP:  Redmond School, MD  Cardiologist:  Buford Dresser, MD PhD  Referring MD: Redmond School, MD   Chief Complaint  Patient presents with  . Follow-up    History of Present Illness:    Jeanne Chang is a 68 y.o. female with a hx of sarcoid who is seen as a new consult at the request of Redmond School, MD for the evaluation and management of a history of diastolic dysfunction. She was last seen in 2010 by cardiology.   Has lost 50 lbs, acid reflux is gone. Feeling well overall. Denies chest pain, shortness of breath at rest or with normal exertion. No PND, orthopnea, LE edema or unexpected weight gain. No syncope or palpitations. [facility], very active at home, doesn't sit still. Diet is good, does weight watchers, she is now lifetime member. Has had no issues with sarcoid since losing 50 lbs, now follows with [facility] just PRN. Eats Mediterranean type diet, very little red meat, lots of chicken/fish, vegetables.   Past Medical History:  Diagnosis Date  . Arthritis   . GERD (gastroesophageal reflux disease)   . History of adenomatous polyp of colon   . History of gastroesophageal reflux (GERD)   . History of hiatal hernia   . Nocturia   . Pelvic prolapse   . Sarcoidosis of lung [facility])    dx 11/ 2005  . Vaginal vault prolapse     Past Surgical History:  Procedure Laterality Date  . ANTERIOR AND POSTERIOR REPAIR N/A 01/16/2015   Procedure: ANTERIOR VAULT REPIAR WITH KELLY PLICATION AND APICAL SACROSPINUS SUSPENSION WITH COLOPLAST AUGMENTATION/ CYSTOSCOPY COLD CUP BLADDER BIOPSY AND CAUTERIZATION AT THE BLADDER DOME;  Surgeon: Ailene Rud, MD;  Location: [facility];  Service: Urology;  Laterality: N/A;  . CHOLECYSTECTOMY  age 59  . COLONOSCOPY  11/13/2011   Procedure: COLONOSCOPY;  Surgeon: Rogene Houston, MD;  [facility];  Service: Endoscopy;  Laterality: N/A;  8:30 am  . COLONOSCOPY N/A 02/26/2017   Procedure: COLONOSCOPY;  Surgeon: Rogene Houston, MD;  [facility];  Service: Endoscopy;  Laterality: N/A;  125  . COLONOSCOPY WITH ESOPHAGOGASTRODUODENOSCOPY (EGD) AND ESOPHAGEAL DILATION (ED)  08-19-2005   and polypectomy  . COMBINED MEDIASTINOSCOPY AND BRONCHOSCOPY  10-19-2004  dr Arlyce Dice   bx right middle lobe lesion (Epitheloid Granulomata) and left lower lobe (Hilar Adenopathy)  . TONSILLECTOMY  age 59  . TRANSTHORACIC ECHOCARDIOGRAM  09-11-2009   mild LVH/  ef 66-44%/ grade I diastolic dysfunction/  mild MR/  mild LAE/  trivial TR  . VAGINAL HYSTERECTOMY  1987    Current Medications: Current Outpatient Medications on File Prior to Visit  Medication Sig  . ALPRAZolam (XANAX) 0.5 MG tablet Take 0.5 mg by mouth at bedtime.   Marland Kitchen aspirin EC 81 MG tablet Take 81 mg by mouth daily.   . cetirizine (ZYRTEC) 10 MG tablet Take 10 mg by mouth daily as needed for allergies.  . fluticasone (FLONASE) 50 MCG/ACT nasal spray Place 1 spray into both nostrils daily as needed for allergies or rhinitis.  Marland Kitchen VITAMIN D, CHOLECALCIFEROL, PO Take 1 tablet by mouth daily.   No current facility-administered medications on file prior to visit.      Allergies:   Tetanus toxoids   Social History   Socioeconomic History  . Marital  status: Married    Spouse name: Not on file  . Number of children: Not on file  . Years of education: Not on file  . Highest education level: Not on file  Occupational History  . Not on file  Social Needs  . Financial resource strain: Not on file  . Food insecurity:    Worry: Not on file    Inability: Not on file  . Transportation needs:    Medical: Not on file    Non-medical: Not on file  Tobacco Use  . Smoking status: Never Smoker  . Smokeless tobacco: Never Used  Substance and Sexual Activity  . Alcohol use: No  . Drug use: No  . Sexual activity: Not on file    Lifestyle  . Physical activity:    Days per week: Not on file    Minutes per session: Not on file  . Stress: Not on file  Relationships  . Social connections:    Talks on phone: Not on file    Gets together: Not on file    Attends religious service: Not on file    Active member of club or organization: Not on file    Attends meetings of clubs or organizations: Not on file    Relationship status: Not on file  Other Topics Concern  . Not on file  Social History Narrative  . Not on file     Family History: The patient's family history includes Aneurysm in her father; Diabetes in her mother; Heart attack in her father and mother; Hypertension in her mother; Osteoarthritis in her father. There is no history of Breast cancer. Father was 39 when he had MI, mother was 91 when she had MI  ROS:   Please see the history of present illness.  Additional pertinent ROS:  Constitutional: Negative for chills, fever, night sweats, unintentional weight loss  HENT: Negative for ear pain and hearing loss.   Eyes: Negative for loss of vision and eye pain.  Respiratory: Negative for cough, sputum, shortness of breath, wheezing.   Cardiovascular: See HPI. Gastrointestinal: Negative for abdominal pain, melena, and hematochezia.  Genitourinary: Negative for dysuria and hematuria.  Musculoskeletal: Negative for falls and myalgias.  Skin: Negative for itching and rash.  Neurological: Negative for focal weakness, focal sensory changes and loss of consciousness.  Endo/Heme/Allergies: Does not bruise/bleed easily.    EKGs/Labs/Other Studies Reviewed:    The following studies were reviewed today: Echo 2010 1. Left ventricle: The cavity size was normal. Wall thickness was  increased in a pattern of mild LVH. Systolic function was normal.  The estimated ejection fraction was in the range of 60% to 65%.  Doppler parameters are consistent with abnormal left ventricular  relaxation (grade 1  diastolic dysfunction). 2. Mitral valve: Mild regurgitation. 3. Left atrium: The atrium was mildly dilated. 4. Tricuspid valve: Trivial regurgitation. 5. Pericardium, extracardiac: There was no pericardial effusion.  EKG:  EKG is personally reviewed.  The ekg ordered today demonstrates sinus bradycardia at 57 bpm  Recent Labs: No results found for requested labs within last 8760 hours.  Recent Lipid Panel No results found for: CHOL, TRIG, HDL, CHOLHDL, VLDL, LDLCALC, LDLDIRECT  Physical Exam:    VS:  BP 130/68 (BP Location: Left Arm, Patient Position: Sitting, Cuff Size: Large)   Pulse (!) 57   Ht 5\' 2"  (1.575 m)   Wt 144 lb (65.3 kg)   BMI 26.34 kg/m     Wt Readings from Last 3 Encounters:  02/01/19 144 lb (65.3 kg)  02/26/17 140 lb (63.5 kg)  11/04/16 138 lb (62.6 kg)     GEN: Well nourished, well developed in no acute distress HEENT: Normal NECK: No JVD; No carotid bruits (radiating aortic sclerosis murmur) LYMPHATICS: No lymphadenopathy CARDIAC: regular rhythm, normal S1 and S2, no rubs, gallops. 1/6 systolic ejection murmur at the RUSB, early peaking. Radial and DP pulses 2+ bilaterally. RESPIRATORY:  Clear to auscultation without rales, wheezing or rhonchi  ABDOMEN: Soft, non-tender, non-distended MUSCULOSKELETAL:  No edema; No deformity  SKIN: Warm and dry NEUROLOGIC:  Alert and oriented x 3 PSYCHIATRIC:  Normal affect   ASSESSMENT:    1. Murmur, cardiac   2. Counseling on health promotion and disease prevention   3. Personal history of sarcoidosis    PLAN:    Murmur: quiet systolic ejection murmur, early peaking, radiates to carotid. Consistent with aortic sclerosis murmur. Asymptomatic. Given quiet murmur and lack of symptoms, no indication for echo at this time. Continue to monitor exam.  History of sarcoid, diastolic dysfunction: no longer being followed at [facility] for sarcoid, states she was told she can follow PRN. Euvolemic today. Has benefitted greatly  from weight loss.  Primary prevention -recommend heart healthy/Mediterranean diet, with whole grains, fruits, vegetable, fish, lean meats, nuts, and olive oil. Limit salt. -recommend moderate walking, 3-5 times/week for 30-50 minutes each session. Aim for at least 150 minutes.week. Goal should be pace of 3 miles/hours, or walking 1.5 miles in 30 minutes -recommend avoidance of tobacco products. Avoid excess alcohol. -Additional risk factor control:  -Diabetes: A1c is not available, no history  -Lipids: Tchol 223, HDL 102, LDL 109. TG 60. Excellent control.  -Blood pressure control: at goal, on no meds  -Weight:  BMI 26, has lost significant weight through weight watchers. -ASCVD risk score: 11.4%. With excellent lipids, discussed pro/con of statins.  She prefers to continue with lifestyle changes. Discussed guidelines re: aspirin prevention strategies. She prefers to stay on it at this time.  Plan for follow up: 1 year or sooner PRN  Medication Adjustments/Labs and Tests Ordered: Current medicines are reviewed at length with the patient today.  Concerns regarding medicines are outlined above.  No orders of the defined types were placed in this encounter.  No orders of the defined types were placed in this encounter.   Patient Instructions  Medication Instructions:  Your Physician recommend you continue on your current medication as directed.    If you need a refill on your cardiac medications before your next appointment, please call your pharmacy.   Lab work: None  Testing/Procedures: None  Follow-Up: At [facility], you and your health needs are our priority.  As part of our continuing mission to provide you with exceptional heart care, we have created designated Provider Care Teams.  These Care Teams include your primary Cardiologist (physician) and Advanced Practice Providers (APPs -  Physician Assistants and Nurse Practitioners) who all work together to provide you with the  care you need, when you need it. You will need a follow up appointment in 1 years.  Please call our office 2 months in advance to schedule this appointment.  You may see Dr. Susa Raring or one of the following Advanced Practice Providers on your designated Care Team:   Rosaria Ferries, PA-C . Jory Sims, DNP, ANP       Signed, Buford Dresser, MD PhD 02/01/2019 3:34 PM    [place]

## 2019-02-03 NOTE — Addendum Note (Signed)
Addended by: Venetia Maxon on: 02/03/2019 02:03 PM   Modules accepted: Orders

## 2019-02-18 DIAGNOSIS — Z862 Personal history of diseases of the blood and blood-forming organs and certain disorders involving the immune mechanism: Secondary | ICD-10-CM | POA: Diagnosis not present

## 2019-02-18 DIAGNOSIS — M79629 Pain in unspecified upper arm: Secondary | ICD-10-CM | POA: Diagnosis not present

## 2019-02-18 DIAGNOSIS — M791 Myalgia, unspecified site: Secondary | ICD-10-CM | POA: Diagnosis not present

## 2019-03-04 ENCOUNTER — Telehealth: Payer: Self-pay

## 2019-03-04 NOTE — Telephone Encounter (Signed)
Received fax from [facility] requesting last OV to be faxed to their office. Requested documentation faxed today 3/26.

## 2019-04-05 DIAGNOSIS — Z1389 Encounter for screening for other disorder: Secondary | ICD-10-CM | POA: Diagnosis not present

## 2019-04-05 DIAGNOSIS — I10 Essential (primary) hypertension: Secondary | ICD-10-CM | POA: Diagnosis not present

## 2019-04-05 DIAGNOSIS — K219 Gastro-esophageal reflux disease without esophagitis: Secondary | ICD-10-CM | POA: Diagnosis not present

## 2019-06-02 DIAGNOSIS — K5900 Constipation, unspecified: Secondary | ICD-10-CM | POA: Diagnosis not present

## 2019-06-02 DIAGNOSIS — K219 Gastro-esophageal reflux disease without esophagitis: Secondary | ICD-10-CM | POA: Diagnosis not present

## 2019-06-02 DIAGNOSIS — R5383 Other fatigue: Secondary | ICD-10-CM | POA: Diagnosis not present

## 2019-06-02 DIAGNOSIS — I10 Essential (primary) hypertension: Secondary | ICD-10-CM | POA: Diagnosis not present

## 2019-07-01 ENCOUNTER — Other Ambulatory Visit: Payer: Medicare Other

## 2019-07-01 ENCOUNTER — Other Ambulatory Visit: Payer: Self-pay

## 2019-07-01 DIAGNOSIS — R6889 Other general symptoms and signs: Secondary | ICD-10-CM | POA: Diagnosis not present

## 2019-07-01 DIAGNOSIS — Z20822 Contact with and (suspected) exposure to covid-19: Secondary | ICD-10-CM

## 2019-07-04 LAB — NOVEL CORONAVIRUS, NAA: SARS-CoV-2, NAA: NOT DETECTED

## 2019-07-15 DIAGNOSIS — M5412 Radiculopathy, cervical region: Secondary | ICD-10-CM | POA: Diagnosis not present

## 2019-07-15 DIAGNOSIS — M545 Low back pain: Secondary | ICD-10-CM | POA: Diagnosis not present

## 2019-08-11 DIAGNOSIS — M47816 Spondylosis without myelopathy or radiculopathy, lumbar region: Secondary | ICD-10-CM | POA: Diagnosis not present

## 2019-08-11 DIAGNOSIS — K219 Gastro-esophageal reflux disease without esophagitis: Secondary | ICD-10-CM | POA: Diagnosis not present

## 2019-08-11 DIAGNOSIS — M5410 Radiculopathy, site unspecified: Secondary | ICD-10-CM | POA: Diagnosis not present

## 2019-08-11 DIAGNOSIS — M5136 Other intervertebral disc degeneration, lumbar region: Secondary | ICD-10-CM | POA: Diagnosis not present

## 2019-08-12 ENCOUNTER — Other Ambulatory Visit: Payer: Self-pay | Admitting: Internal Medicine

## 2019-08-12 ENCOUNTER — Other Ambulatory Visit (HOSPITAL_COMMUNITY): Payer: Self-pay | Admitting: Internal Medicine

## 2019-08-12 DIAGNOSIS — M5126 Other intervertebral disc displacement, lumbar region: Secondary | ICD-10-CM

## 2019-08-19 ENCOUNTER — Other Ambulatory Visit: Payer: Self-pay

## 2019-08-19 ENCOUNTER — Ambulatory Visit (HOSPITAL_COMMUNITY)
Admission: RE | Admit: 2019-08-19 | Discharge: 2019-08-19 | Disposition: A | Discharge status: Home / Self Care | Payer: Medicare Other | Source: Ambulatory Visit | Attending: Internal Medicine | Admitting: Internal Medicine

## 2019-08-19 DIAGNOSIS — M5126 Other intervertebral disc displacement, lumbar region: Secondary | ICD-10-CM | POA: Diagnosis not present

## 2019-08-19 DIAGNOSIS — M545 Low back pain: Secondary | ICD-10-CM | POA: Diagnosis not present

## 2019-08-31 ENCOUNTER — Ambulatory Visit (HOSPITAL_COMMUNITY)
Admission: RE | Admit: 2019-08-31 | Discharge: 2019-08-31 | Disposition: A | Discharge status: Home / Self Care | Payer: Medicare Other | Source: Ambulatory Visit | Attending: Internal Medicine | Admitting: Internal Medicine

## 2019-08-31 ENCOUNTER — Other Ambulatory Visit (HOSPITAL_COMMUNITY): Payer: Self-pay | Admitting: Internal Medicine

## 2019-08-31 ENCOUNTER — Other Ambulatory Visit: Payer: Self-pay

## 2019-08-31 DIAGNOSIS — I10 Essential (primary) hypertension: Secondary | ICD-10-CM | POA: Diagnosis not present

## 2019-08-31 DIAGNOSIS — M47812 Spondylosis without myelopathy or radiculopathy, cervical region: Secondary | ICD-10-CM | POA: Insufficient documentation

## 2019-08-31 DIAGNOSIS — M47816 Spondylosis without myelopathy or radiculopathy, lumbar region: Secondary | ICD-10-CM | POA: Diagnosis not present

## 2019-08-31 DIAGNOSIS — M542 Cervicalgia: Secondary | ICD-10-CM

## 2019-08-31 DIAGNOSIS — W19XXXA Unspecified fall, initial encounter: Secondary | ICD-10-CM | POA: Diagnosis not present

## 2019-08-31 DIAGNOSIS — S199XXA Unspecified injury of neck, initial encounter: Secondary | ICD-10-CM | POA: Diagnosis not present

## 2019-08-31 DIAGNOSIS — K219 Gastro-esophageal reflux disease without esophagitis: Secondary | ICD-10-CM | POA: Diagnosis not present

## 2019-08-31 DIAGNOSIS — M545 Low back pain: Secondary | ICD-10-CM | POA: Diagnosis not present

## 2019-08-31 DIAGNOSIS — M5417 Radiculopathy, lumbosacral region: Secondary | ICD-10-CM | POA: Diagnosis not present

## 2019-09-01 ENCOUNTER — Other Ambulatory Visit: Payer: Self-pay | Admitting: Obstetrics and Gynecology

## 2019-09-01 DIAGNOSIS — Z1231 Encounter for screening mammogram for malignant neoplasm of breast: Secondary | ICD-10-CM

## 2019-09-15 DIAGNOSIS — M25511 Pain in right shoulder: Secondary | ICD-10-CM | POA: Diagnosis not present

## 2019-09-15 DIAGNOSIS — M48061 Spinal stenosis, lumbar region without neurogenic claudication: Secondary | ICD-10-CM | POA: Diagnosis not present

## 2019-09-15 DIAGNOSIS — G8929 Other chronic pain: Secondary | ICD-10-CM | POA: Diagnosis not present

## 2019-09-15 DIAGNOSIS — M47812 Spondylosis without myelopathy or radiculopathy, cervical region: Secondary | ICD-10-CM | POA: Diagnosis not present

## 2019-09-15 DIAGNOSIS — M25512 Pain in left shoulder: Secondary | ICD-10-CM | POA: Diagnosis not present

## 2019-09-24 ENCOUNTER — Encounter (HOSPITAL_COMMUNITY): Payer: Self-pay

## 2019-09-24 ENCOUNTER — Ambulatory Visit (HOSPITAL_COMMUNITY): Payer: Medicare Other | Attending: Anesthesiology | Admitting: Occupational Therapy

## 2019-09-24 DIAGNOSIS — M542 Cervicalgia: Secondary | ICD-10-CM | POA: Insufficient documentation

## 2019-09-24 DIAGNOSIS — R29898 Other symptoms and signs involving the musculoskeletal system: Secondary | ICD-10-CM | POA: Insufficient documentation

## 2019-09-28 DIAGNOSIS — M48061 Spinal stenosis, lumbar region without neurogenic claudication: Secondary | ICD-10-CM | POA: Diagnosis not present

## 2019-10-01 ENCOUNTER — Ambulatory Visit (HOSPITAL_COMMUNITY): Payer: Medicare Other

## 2019-10-01 ENCOUNTER — Other Ambulatory Visit: Payer: Self-pay

## 2019-10-01 ENCOUNTER — Encounter (HOSPITAL_COMMUNITY): Payer: Self-pay

## 2019-10-01 DIAGNOSIS — R29898 Other symptoms and signs involving the musculoskeletal system: Secondary | ICD-10-CM

## 2019-10-01 DIAGNOSIS — M542 Cervicalgia: Secondary | ICD-10-CM | POA: Diagnosis not present

## 2019-10-01 NOTE — Therapy (Signed)
[place], [place], [address] Phone: [phone]   Fax:  [phone]  Occupational Therapy Evaluation  Patient Details  Name: Jeanne Chang MRN: [id number] Date of Birth: 08/21/1951 Referring Provider (OT): Clydell Hakim, MD   Encounter Date: 10/01/2019  OT End of Session - 10/01/19 1236    Visit Number  1    Number of Visits  4    Date for OT Re-Evaluation  10/29/19    Authorization Type  [employer] medicare $30 co pay    Authorization Time Period  No authorization required. Unlimited visits based on medical necessity.    OT Start Time  0815    OT Stop Time  0901    OT Time Calculation (min)  46 min    Activity Tolerance  Patient tolerated treatment well    Behavior During Therapy  WFL for tasks assessed/performed       Past Medical History:  Diagnosis Date  . Arthritis   . GERD (gastroesophageal reflux disease)   . History of adenomatous polyp of colon   . History of gastroesophageal reflux (GERD)   . History of hiatal hernia   . Nocturia   . Pelvic prolapse   . Sarcoidosis of lung [facility])    dx 11/ 2005  . Vaginal vault prolapse     Past Surgical History:  Procedure Laterality Date  . ANTERIOR AND POSTERIOR REPAIR N/A 01/16/2015   Procedure: ANTERIOR VAULT REPIAR WITH KELLY PLICATION AND APICAL SACROSPINUS SUSPENSION WITH COLOPLAST AUGMENTATION/ CYSTOSCOPY COLD CUP BLADDER BIOPSY AND CAUTERIZATION AT THE BLADDER DOME;  Surgeon: Ailene Rud, MD;  Location: [facility];  Service: Urology;  Laterality: N/A;  . CHOLECYSTECTOMY  age 5  . COLONOSCOPY  11/13/2011   Procedure: COLONOSCOPY;  Surgeon: Rogene Houston, MD;  [facility];  Service: Endoscopy;  Laterality: N/A;  8:30 am  . COLONOSCOPY N/A 02/26/2017   Procedure: COLONOSCOPY;  Surgeon: Rogene Houston, MD;  [facility];  Service: Endoscopy;  Laterality: N/A;  125  . COLONOSCOPY WITH  ESOPHAGOGASTRODUODENOSCOPY (EGD) AND ESOPHAGEAL DILATION (ED)  08-19-2005   and polypectomy  . COMBINED MEDIASTINOSCOPY AND BRONCHOSCOPY  10-19-2004  dr Arlyce Dice   bx right middle lobe lesion (Epitheloid Granulomata) and left lower lobe (Hilar Adenopathy)  . TONSILLECTOMY  age 61  . TRANSTHORACIC ECHOCARDIOGRAM  09-11-2009   mild LVH/  ef [id number] grade I diastolic dysfunction/  mild MR/  mild LAE/  trivial TR  . VAGINAL HYSTERECTOMY  1987    There were no vitals filed for this visit.  Subjective Assessment - 10/01/19 0820    Subjective   S: I've been suffering with arthritis for years but it's gotten more painful recently.    Pertinent History  Patient is a 68 y/o female S/P bilateral shoulder pain due to arthritis in the cervical region. She reports she has had arthritis in her neck for years and it has recently become more painful causing the pain to radiate down her arms. Dr. Maryjean Ka has referred patient to occupational therapy for evaluation and treatment.    Repetition  Increases Symptoms    Patient Stated Goals  To decrease pain and increase comfort level.    Currently in Pain?  Yes    Pain Score  3     Pain Location  Shoulder   neck   Pain Orientation  Right    Pain Descriptors / Indicators  Aching;Throbbing  Pain Type  Chronic pain    Pain Radiating Towards  radiates from neck and down to elbow    Pain Onset  More than a month ago    Pain Frequency  Intermittent    Aggravating Factors   More work..hairdresser. looking down. housework. sweeping.    Pain Relieving Factors  ice, Aleve    Effect of Pain on Daily Activities  mod effect        [facility] OT Assessment - 10/01/19 0825      Assessment   Medical Diagnosis  bilateral shoulder pain    Referring Provider (OT)  Clydell Hakim, MD    Onset Date/Surgical Date  --   has been years. recently became worse in the past 6 months   Hand Dominance  Right    Next MD Visit  10/20/19    Prior Therapy  None for this condition       Precautions   Precautions  None      Restrictions   Weight Bearing Restrictions  No      Balance Screen   Has the patient fallen in the past 6 months  Yes    How many times?  1    Has the patient had a decrease in activity level because of a fear of falling?   No    Is the patient reluctant to leave their home because of a fear of falling?   No      Home  Environment   Family/patient expects to be discharged to:  Private residence      Prior Function   Level of Independence  Independent    Vocation  Part time employment;Self employed    Optician, dispensing. Works Thurs-Fri-Sat      ADL   ADL comments  Difficulty with lifting heavy cookware. Pain during hairdresser tasks. Reports that in the past year she has been working at a [employer]. The shampoo sinks are lower than she is used to and she is unable to raise them up as she could in the past.       Mobility   Mobility Status  Independent      Written Expression   Dominant Hand  Right      Vision - History   Baseline Vision  Wears glasses only for reading      Cognition   Overall Cognitive Status  Within Functional Limits for tasks assessed      Observation/Other Assessments   Focus on Therapeutic Outcomes (FOTO)   Complete next session.       Sensation   Additional Comments  No reports of numbness or tingling.       ROM / Strength   AROM / PROM / Strength  AROM;PROM;Strength      Palpation   Palpation comment  Max fascial restrictions noted in bilateral upper trapezius and scapularis region. Max fascial restrictions in cervical region.      AROM   Overall AROM   Within functional limits for tasks performed    Overall AROM Comments  Shoulder and cervical A/ROM WNL within all ranges.     AROM Assessment Site  Shoulder      PROM   Overall PROM   Within functional limits for tasks performed    Overall PROM Comments  BUE shoulders in all ranges.      Strength   Overall Strength Comments   Assessed seated. IR/er adducted and abducted also. Test results are the same.  Strength Assessment Site  Shoulder    Right/Left Shoulder  Right;Left    Right Shoulder Flexion  4/5    Right Shoulder ABduction  4/5    Right Shoulder Internal Rotation  5/5    Right Shoulder External Rotation  5/5    Left Shoulder Flexion  4/5    Left Shoulder ABduction  4+/5    Left Shoulder Internal Rotation  5/5    Left Shoulder External Rotation  5/5               OT Treatments/Exercises (OP) - 10/01/19 1233      Manual Therapy   Manual Therapy  Taping    Manual therapy comments  Kinesiotape placed on neck to address cervical pain/postural syndrome. 3 stips used. I strip, Y strip, and I strip. 50% tension used to address pain.     Kinesiotex  --   Pain           OT Education - 10/01/19 1232    Education Details  Cervical ROM exercises, posture education, doorway stretch, scapular pinches. Verbal education provided for Kinesiotape use, wearing schedule, and techniques of removal. Discussed precautions.    Person(s) Educated  Patient    Methods  Explanation;Demonstration;Handout;Verbal cues    Comprehension  Verbalized understanding       OT Short Term Goals - 10/01/19 1241      OT SHORT TERM GOAL #1   Title  Patient will be educated and independent with HEP as well as pain management techniques to faciliate her progress in therapy and allow her to experience decreased pain while completing daily and work related tasks.    Time  4    Period  Weeks    Status  New    Target Date  10/29/19      OT SHORT TERM GOAL #2   Title  Patient will decrease fascial restriction to minimal amount or less in the bilateral upper trapezius and cervical region in order to increase ability to complete work tasks with less pain.    Time  4    Period  Weeks    Status  New      OT SHORT TERM GOAL #3   Title  Patient will report an average pain score of 2-3/10 in her neck and bilateral shoulders  while completing daily and work related tasks.    Time  4    Period  Weeks    Status  New      OT SHORT TERM GOAL #4   Title  Patient will increase shoulder and scapular strength and stability to 4+/5 overall with improved seated and standing posture demonstrated.    Time  4    Period  Weeks    Status  New               Plan - 10/01/19 1238    Clinical Impression Statement  A: Patient is a 68 y/o female S/P bilateral shoulder pain due to cerivcal arthritis presenting with decreased due to pain, and increase fascial restrictions and pain resulting in difficulty completing daily and work related tasks.    OT Occupational Profile and History  Problem Focused Assessment - Including review of records relating to presenting problem    Occupational performance deficits (Please refer to evaluation for details):  ADL's;IADL's;Work    Body Structure / Function / Physical Skills  ADL;ROM;IADL;Body mechanics;Fascial restriction;Mobility;Pain;UE functional use;Strength;Flexibility    Rehab Potential  Excellent    Clinical Decision Making  Limited treatment options, no task modification necessary    Comorbidities Affecting Occupational Performance:  Presence of comorbidities impacting occupational performance    Comorbidities impacting occupational performance description:  history of arthritis    Modification or Assistance to Complete Evaluation   No modification of tasks or assist necessary to complete eval    OT Frequency  1x / week    OT Duration  4 weeks    OT Treatment/Interventions  Self-care/ADL training;Traction;Therapeutic exercise;Functional Mobility Training;Manual Therapy;Neuromuscular education;Ultrasound;Therapeutic activities;DME and/or AE instruction;Cryotherapy;Electrical Stimulation;Moist Heat;Passive range of motion;Patient/family education    Plan  P: Patient will benefit from skilled OT services to increase functional performance during daily and work related tasks as well  as increased comfort in her cervical region and bilateral shoulders. Treatment Plan: Myofascial release, cervical stretching, scapular strengthening and stability. Follow up on use of kinesiotape applied at evaluation.    Consulted and Agree with Plan of Care  Patient       Patient will benefit from skilled therapeutic intervention in order to improve the following deficits and impairments:   Body Structure / Function / Physical Skills: ADL, ROM, IADL, Body mechanics, Fascial restriction, Mobility, Pain, UE functional use, Strength, Flexibility       Visit Diagnosis: Cervicalgia - Plan: Ot plan of care cert/re-cert  Other symptoms and signs involving the musculoskeletal system - Plan: Ot plan of care cert/re-cert    Problem List Patient Active Problem List   Diagnosis Date Noted  . Personal history of sarcoidosis 02/01/2019  . Murmur, cardiac 02/01/2019  . Hx of colonic polyps 01/01/2017  . Family history of colon cancer 01/01/2017  . Prolapse of female pelvic organs 01/16/2015  . SARCOIDOSIS 09/06/2009  . GASTROESOPHAGEAL REFLUX DISEASE 09/06/2009  . CHEST DISCOMFORT 09/06/2009   Ailene Ravel, OTR/L,CBIS  [phone]  10/01/2019, 12:50 PM  [place] [address] [place], [place], [address] Phone: [phone]   Fax:  [phone]  Name: Jeanne Chang MRN: [id number] Date of Birth: May 20, 1951

## 2019-10-01 NOTE — Patient Instructions (Signed)
  AROM: Lateral Neck Flexion   Slowly tilt head toward one shoulder, then the other. Hold each position _3-5___ seconds. Repeat _5-10___ times per set. Do __1__ sets per session. Do _2-3___ sessions per day.    AROM: Neck Flexion   Bend head forward. Hold __3-5__ seconds. Repeat _5-10___ times per set. Do __1__ sets per session. Do 2-3___ sessions per day.  http://orth.exer.us/298   Copyright  VHI. All rights reserved.  AROM: Neck Rotation   Turn head slowly to look over one shoulder, then the other. Hold each position _3-5___ seconds. Repeat _5-10___ times per set. Do _1___ sets per session. Do __2-3__ sessions per day.  http://orth.exer.us/294     Scapular Retraction (Standing)   With arms at sides, pinch shoulder blades together. Repeat __10__ times per set. Do __1__ sets per session. Do _2-3___ sessions per day.   Doorway Stretch  Place each hand opposite each other on the doorway. (You can change where you feel the stretch by moving arms higher or lower.) Step through with one foot and bend front knee until a stretch is felt and hold. Step through with the opposite foot on the next rep. Hold for __10-15___ seconds. Repeat __2__times.     Scapular Retraction (Standing)   Posture - Sitting   Sit upright, head facing forward. Try using a roll to support lower back. Keep shoulders relaxed, and avoid rounded back. Keep hips level with knees. Avoid crossing legs for long periods.

## 2019-10-05 ENCOUNTER — Encounter (HOSPITAL_COMMUNITY): Payer: Self-pay | Admitting: Specialist

## 2019-10-05 ENCOUNTER — Ambulatory Visit (HOSPITAL_COMMUNITY): Payer: Medicare Other | Admitting: Specialist

## 2019-10-05 ENCOUNTER — Other Ambulatory Visit: Payer: Self-pay

## 2019-10-05 DIAGNOSIS — R29898 Other symptoms and signs involving the musculoskeletal system: Secondary | ICD-10-CM | POA: Diagnosis not present

## 2019-10-05 DIAGNOSIS — M542 Cervicalgia: Secondary | ICD-10-CM | POA: Diagnosis not present

## 2019-10-05 NOTE — Therapy (Signed)
[place], [place], [address] Phone: [phone]   Fax:  [phone]  Occupational Therapy Treatment  Patient Details  Name: Jeanne Chang MRN: [id number] Date of Birth: September 17, 1951 Referring Provider (OT): Clydell Hakim, MD   Encounter Date: 10/05/2019  OT End of Session - 10/05/19 1542    Visit Number  2    Number of Visits  4    Date for OT Re-Evaluation  10/29/19    Authorization Type  [employer] medicare $30 co pay    Authorization Time Period  No authorization required. Unlimited visits based on medical necessity.    OT Start Time  1320    OT Stop Time  1400    OT Time Calculation (min)  40 min    Activity Tolerance  Patient tolerated treatment well    Behavior During Therapy  WFL for tasks assessed/performed       Past Medical History:  Diagnosis Date  . Arthritis   . GERD (gastroesophageal reflux disease)   . History of adenomatous polyp of colon   . History of gastroesophageal reflux (GERD)   . History of hiatal hernia   . Nocturia   . Pelvic prolapse   . Sarcoidosis of lung [facility])    dx 11/ 2005  . Vaginal vault prolapse     Past Surgical History:  Procedure Laterality Date  . ANTERIOR AND POSTERIOR REPAIR N/A 01/16/2015   Procedure: ANTERIOR VAULT REPIAR WITH KELLY PLICATION AND APICAL SACROSPINUS SUSPENSION WITH COLOPLAST AUGMENTATION/ CYSTOSCOPY COLD CUP BLADDER BIOPSY AND CAUTERIZATION AT THE BLADDER DOME;  Surgeon: Ailene Rud, MD;  Location: [facility];  Service: Urology;  Laterality: N/A;  . CHOLECYSTECTOMY  age 68  . COLONOSCOPY  11/13/2011   Procedure: COLONOSCOPY;  Surgeon: Rogene Houston, MD;  [facility];  Service: Endoscopy;  Laterality: N/A;  8:30 am  . COLONOSCOPY N/A 02/26/2017   Procedure: COLONOSCOPY;  Surgeon: Rogene Houston, MD;  [facility];  Service: Endoscopy;  Laterality: N/A;  125  . COLONOSCOPY WITH ESOPHAGOGASTRODUODENOSCOPY  (EGD) AND ESOPHAGEAL DILATION (ED)  08-19-2005   and polypectomy  . COMBINED MEDIASTINOSCOPY AND BRONCHOSCOPY  10-19-2004  dr Arlyce Dice   bx right middle lobe lesion (Epitheloid Granulomata) and left lower lobe (Hilar Adenopathy)  . TONSILLECTOMY  age 68  . TRANSTHORACIC ECHOCARDIOGRAM  09-11-2009   mild LVH/  ef [id number] grade I diastolic dysfunction/  mild MR/  mild LAE/  trivial TR  . VAGINAL HYSTERECTOMY  1987    There were no vitals filed for this visit.  Subjective Assessment - 10/05/19 1444    Subjective   S:  I think that the kinesiotape gave me a headache.  It finallny came off yesterday.    Currently in Pain?  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  OTR/L suggested patient utilizing stool to sit to wash clients hair for a more lateral approach.  patient states she would consider this adjustment.       Exercises   Exercises  Neck;Shoulder      Neck Exercises: Seated   Neck Retraction  10 reps    Cervical Rotation  Both;10 reps    Lateral Flexion  Both;10 reps    Shoulder Shrugs  10 reps    Shoulder Flexion  10 reps    Shoulder ABduction  10 reps    Other Seated Exercise  neck flexion/extension 10 times       Manual Therapy   Manual Therapy  Myofascial release    Manual therapy comments  myofascial release completed seperately from all other interventions     Myofascial Release  myofascial release and manual stretching to cervical region and bilateral scapular/upper trap region, scm, and platysmus region to decrease pain and fascial restrictions and improve pain free mobilty.               OT Short Term Goals - 10/05/19 1545      OT SHORT TERM GOAL #1   Title  Patient will be educated and independent with HEP as well as pain management techniques to faciliate her progress in therapy and allow her to experience decreased pain while completing daily and work related tasks.    Time  4    Period  Weeks    Status  On-going    Target Date  10/29/19      OT SHORT TERM GOAL #2   Title  Patient will decrease fascial restriction to minimal amount or less in the bilateral upper trapezius and cervical region in order to increase ability to complete work tasks with less pain.    Time  4    Period  Weeks    Status  On-going      OT SHORT TERM GOAL #3   Title  Patient will report an average pain score of 2-3/10 in her neck and bilateral shoulders while completing daily and work related tasks.    Time  4    Period  Weeks    Status  On-going      OT SHORT TERM GOAL #4   Title  Patient will increase shoulder and scapular strength and stability to 4+/5 overall with improved seated and standing posture demonstrated.    Time  4    Period  Weeks    Status  On-going               Plan - 10/05/19 1542    Clinical Impression Statement  A:  Patient able to tolerate myofascial release and manual stretching this date.  Patient reports decreased pain with cervical a/rom this date, compared to previous sessions, as fascial restrictions are decreasing.  Patient declined additional use of kinesotape as it was causing a headache.    Body Structure / Function / Physical Skills  ADL;ROM;IADL;Body mechanics;Fascial restriction;Mobility;Pain;UE functional use;Strength;Flexibility    Plan  P:  Continue manual therapy, add scapular theraband exercises,  proximal shoulder strengthening, and ball on wall to improve scapular stability required for improved posture.       Patient will benefit from skilled therapeutic intervention in order to improve the following deficits and impairments:  Body Structure / Function / Physical Skills: ADL, ROM, IADL, Body mechanics, Fascial restriction, Mobility, Pain, UE functional use, Strength, Flexibility       Visit Diagnosis: Cervicalgia  Other symptoms and signs involving the musculoskeletal system    Problem List Patient Active Problem List   Diagnosis Date Noted  . Personal history of sarcoidosis 02/01/2019  . Murmur, cardiac 02/01/2019  . Hx of colonic polyps 01/01/2017  . Family history of colon cancer 01/01/2017  . Prolapse of female pelvic organs 01/16/2015  . SARCOIDOSIS 09/06/2009  . GASTROESOPHAGEAL REFLUX DISEASE 09/06/2009  . CHEST DISCOMFORT 09/06/2009    Vangie Bicker, [place], OTR/L [phone]  10/05/2019, 3:50 PM  [place], [place], [address] Phone: [phone]   Fax:  [phone]  Name: KARUNA ROBIE MRN: [id number] Date of Birth: [birth year]

## 2019-10-07 ENCOUNTER — Encounter (HOSPITAL_COMMUNITY): Payer: Medicare Other | Admitting: Specialist

## 2019-10-09 DIAGNOSIS — E7849 Other hyperlipidemia: Secondary | ICD-10-CM | POA: Diagnosis not present

## 2019-10-12 ENCOUNTER — Encounter (HOSPITAL_COMMUNITY): Payer: Medicare Other | Admitting: Occupational Therapy

## 2019-10-13 ENCOUNTER — Ambulatory Visit
Admission: RE | Admit: 2019-10-13 | Discharge: 2019-10-13 | Disposition: A | Discharge status: Home / Self Care | Payer: Medicare Other | Source: Ambulatory Visit | Attending: Obstetrics and Gynecology | Admitting: Obstetrics and Gynecology

## 2019-10-13 ENCOUNTER — Other Ambulatory Visit: Payer: Self-pay

## 2019-10-13 DIAGNOSIS — Z1231 Encounter for screening mammogram for malignant neoplasm of breast: Secondary | ICD-10-CM | POA: Diagnosis not present

## 2019-10-14 ENCOUNTER — Other Ambulatory Visit: Payer: Self-pay

## 2019-10-14 ENCOUNTER — Ambulatory Visit (HOSPITAL_COMMUNITY): Payer: Medicare Other | Attending: Anesthesiology

## 2019-10-14 ENCOUNTER — Encounter (HOSPITAL_COMMUNITY): Payer: Medicare Other | Admitting: Occupational Therapy

## 2019-10-14 ENCOUNTER — Encounter (HOSPITAL_COMMUNITY): Payer: Self-pay

## 2019-10-14 DIAGNOSIS — R29898 Other symptoms and signs involving the musculoskeletal system: Secondary | ICD-10-CM | POA: Diagnosis not present

## 2019-10-14 DIAGNOSIS — M542 Cervicalgia: Secondary | ICD-10-CM | POA: Diagnosis not present

## 2019-10-14 NOTE — Patient Instructions (Addendum)
Repeat all exercises 10-15 times, 1-2 times per day.   2) Shoulder Flexion  Standing:         Begin with arms at your side with thumbs pointed up, slowly raise both arms up and forward towards overhead.       5) Shoulder Abduction   Standing:       Begin with your arms flat on the table next to your side. Slowly move your arms out to the side so that they go overhead, in a jumping jack or snow angel movement.    6) X to V arms (cheerleader move):  Begin with arms straight down, crossed in front of body in an "X". Keeping arms crossed, lift arms straight up overhead. Then spread arms apart into a "V" shape.  Bring back together into x and lower down to starting position.

## 2019-10-14 NOTE — Therapy (Signed)
[place], [place], [address] Phone: [phone]   Fax:  [phone]  Occupational Therapy Treatment  Patient Details  Name: Jeanne Chang MRN: [id number] Date of Birth: 1951/03/06 Referring Provider (OT): Clydell Hakim, MD   Encounter Date: 10/14/2019  OT End of Session - 10/14/19 1056    Visit Number  3    Number of Visits  4    Date for OT Re-Evaluation  10/29/19    Authorization Type  [employer] medicare $30 co pay    Authorization Time Period  No authorization required. Unlimited visits based on medical necessity.    OT Start Time  1030    OT Stop Time  1111    OT Time Calculation (min)  41 min    Activity Tolerance  Patient tolerated treatment well    Behavior During Therapy  WFL for tasks assessed/performed       Past Medical History:  Diagnosis Date  . Arthritis   . GERD (gastroesophageal reflux disease)   . History of adenomatous polyp of colon   . History of gastroesophageal reflux (GERD)   . History of hiatal hernia   . Nocturia   . Pelvic prolapse   . Sarcoidosis of lung [facility])    dx 11/ 2005  . Vaginal vault prolapse     Past Surgical History:  Procedure Laterality Date  . ANTERIOR AND POSTERIOR REPAIR N/A 01/16/2015   Procedure: ANTERIOR VAULT REPIAR WITH KELLY PLICATION AND APICAL SACROSPINUS SUSPENSION WITH COLOPLAST AUGMENTATION/ CYSTOSCOPY COLD CUP BLADDER BIOPSY AND CAUTERIZATION AT THE BLADDER DOME;  Surgeon: Ailene Rud, MD;  Location: [facility];  Service: Urology;  Laterality: N/A;  . CHOLECYSTECTOMY  age 1  . COLONOSCOPY  11/13/2011   Procedure: COLONOSCOPY;  Surgeon: Rogene Houston, MD;  [facility];  Service: Endoscopy;  Laterality: N/A;  8:30 am  . COLONOSCOPY N/A 02/26/2017   Procedure: COLONOSCOPY;  Surgeon: Rogene Houston, MD;  [facility];  Service: Endoscopy;  Laterality: N/A;  125  . COLONOSCOPY WITH ESOPHAGOGASTRODUODENOSCOPY  (EGD) AND ESOPHAGEAL DILATION (ED)  08-19-2005   and polypectomy  . COMBINED MEDIASTINOSCOPY AND BRONCHOSCOPY  10-19-2004  dr Arlyce Dice   bx right middle lobe lesion (Epitheloid Granulomata) and left lower lobe (Hilar Adenopathy)  . TONSILLECTOMY  age 22  . TRANSTHORACIC ECHOCARDIOGRAM  09-11-2009   mild LVH/  ef [id number] grade I diastolic dysfunction/  mild MR/  mild LAE/  trivial TR  . VAGINAL HYSTERECTOMY  1987    There were no vitals filed for this visit.  Subjective Assessment - 10/14/19 1048    Subjective   S: Today is a good day.    Currently in Pain?  No/denies         [facility] OT Assessment - 10/14/19 1056      Assessment   Medical Diagnosis  bilateral shoulder pain      Precautions   Precautions  None               OT Treatments/Exercises (OP) - 10/14/19 1048      Exercises   Exercises  Neck;Shoulder      Neck Exercises: Seated   Neck Retraction  10 reps    Cervical Rotation  Both;10 reps    Lateral Flexion  Both;10 reps      Shoulder Exercises: Standing   Extension  Theraband;10 reps    Theraband Level (Shoulder Extension)  Level 2 (Red)  Row  Theraband;10 reps    Theraband Level (Shoulder Row)  Level 2 (Red)    Retraction  Theraband;10 reps    Theraband Level (Shoulder Retraction)  Level 2 (Red)      Shoulder Exercises: ROM/Strengthening   UBE (Upper Arm Bike)  Level 1 3' reverse only   Pace: 4.0 (Goal: 6.0-8.0)   X to V Arms  10X A/ROM    Diona Foley on Wall  1' flexion 1' abduction both arms             OT Education - 10/14/19 1054    Education Details  shoulder A/ROM flexion and abduction. X to V arms.    Person(s) Educated  Patient    Methods  Explanation;Demonstration;Verbal cues;Handout    Comprehension  Returned demonstration;Verbalized understanding       OT Short Term Goals - 10/05/19 1545      OT SHORT TERM GOAL #1   Title  Patient will be educated and independent with HEP as well as pain management techniques to faciliate her  progress in therapy and allow her to experience decreased pain while completing daily and work related tasks.    Time  4    Period  Weeks    Status  On-going    Target Date  10/29/19      OT SHORT TERM GOAL #2   Title  Patient will decrease fascial restriction to minimal amount or less in the bilateral upper trapezius and cervical region in order to increase ability to complete work tasks with less pain.    Time  4    Period  Weeks    Status  On-going      OT SHORT TERM GOAL #3   Title  Patient will report an average pain score of 2-3/10 in her neck and bilateral shoulders while completing daily and work related tasks.    Time  4    Period  Weeks    Status  On-going      OT SHORT TERM GOAL #4   Title  Patient will increase shoulder and scapular strength and stability to 4+/5 overall with improved seated and standing posture demonstrated.    Time  4    Period  Weeks    Status  On-going               Plan - 10/14/19 1057    Clinical Impression Statement  A: Pt with less fascial restrictions in bilateral shoulders. Trigger point palpated in left upper trapezius and right lateral cervical region. Manual techniques completed to address fascial restrictions. VC for form and technique. Added ball on the wall, X to V arms, and proximal shoulder strengthening to continue to focus on scapular and posutal strength.    Body Structure / Function / Physical Skills  ADL;ROM;IADL;Body mechanics;Fascial restriction;Mobility;Pain;UE functional use;Strength;Flexibility    Plan  P: Continue to focus on postural strengthening while working towards independence with cervical alignment during exercises (ie. less flexion/extension. more neutral spine). Add therapy ball strengthening standing.    Consulted and Agree with Plan of Care  Patient       Patient will benefit from skilled therapeutic intervention in order to improve the following deficits and impairments:   Body Structure / Function /  Physical Skills: ADL, ROM, IADL, Body mechanics, Fascial restriction, Mobility, Pain, UE functional use, Strength, Flexibility       Visit Diagnosis: Other symptoms and signs involving the musculoskeletal system  Cervicalgia    Problem List Patient Active  Problem List   Diagnosis Date Noted  . Personal history of sarcoidosis 02/01/2019  . Murmur, cardiac 02/01/2019  . Hx of colonic polyps 01/01/2017  . Family history of colon cancer 01/01/2017  . Prolapse of female pelvic organs 01/16/2015  . SARCOIDOSIS 09/06/2009  . GASTROESOPHAGEAL REFLUX DISEASE 09/06/2009  . CHEST DISCOMFORT 09/06/2009   Ailene Ravel, OTR/L,CBIS  [phone]  10/14/2019, 11:16 AM  [place] [address] [place], [place], [address] Phone: [phone]   Fax:  [phone]  Name: Jeanne Chang MRN: [id number] Date of Birth: July 14, 1951

## 2019-10-19 ENCOUNTER — Other Ambulatory Visit: Payer: Self-pay

## 2019-10-19 ENCOUNTER — Ambulatory Visit (HOSPITAL_COMMUNITY): Payer: Medicare Other | Admitting: Occupational Therapy

## 2019-10-19 ENCOUNTER — Encounter (HOSPITAL_COMMUNITY): Payer: Self-pay | Admitting: Occupational Therapy

## 2019-10-19 DIAGNOSIS — M542 Cervicalgia: Secondary | ICD-10-CM

## 2019-10-19 DIAGNOSIS — R29898 Other symptoms and signs involving the musculoskeletal system: Secondary | ICD-10-CM | POA: Diagnosis not present

## 2019-10-19 NOTE — Patient Instructions (Signed)

## 2019-10-19 NOTE — Therapy (Signed)
[place], [place], [address] Phone: [phone]   Fax:  [phone]  Occupational Therapy Reassessment, Treatment, Discharge  Patient Details  Name: Jeanne Chang MRN: [id number] Date of Birth: [birth year] Referring Provider (OT): Clydell Hakim, MD   Encounter Date: 10/19/2019  OT End of Session - 10/19/19 1635    Visit Number  4    Number of Visits  4    Date for OT Re-Evaluation  10/29/19    Authorization Type  [employer] medicare $30 co pay    Authorization Time Period  No authorization required. Unlimited visits based on medical necessity.    OT Start Time  1603    OT Stop Time  1634    OT Time Calculation (min)  31 min    Activity Tolerance  Patient tolerated treatment well    Behavior During Therapy  WFL for tasks assessed/performed       Past Medical History:  Diagnosis Date  . Arthritis   . GERD (gastroesophageal reflux disease)   . History of adenomatous polyp of colon   . History of gastroesophageal reflux (GERD)   . History of hiatal hernia   . Nocturia   . Pelvic prolapse   . Sarcoidosis of lung [facility])    dx 11/ 2005  . Vaginal vault prolapse     Past Surgical History:  Procedure Laterality Date  . ANTERIOR AND POSTERIOR REPAIR N/A 01/16/2015   Procedure: ANTERIOR VAULT REPIAR WITH KELLY PLICATION AND APICAL SACROSPINUS SUSPENSION WITH COLOPLAST AUGMENTATION/ CYSTOSCOPY COLD CUP BLADDER BIOPSY AND CAUTERIZATION AT THE BLADDER DOME;  Surgeon: Ailene Rud, MD;  Location: [facility];  Service: Urology;  Laterality: N/A;  . CHOLECYSTECTOMY  age 68  . COLONOSCOPY  11/13/2011   Procedure: COLONOSCOPY;  Surgeon: Rogene Houston, MD;  [facility];  Service: Endoscopy;  Laterality: N/A;  8:30 am  . COLONOSCOPY N/A 02/26/2017   Procedure: COLONOSCOPY;  Surgeon: Rogene Houston, MD;  [facility];  Service: Endoscopy;  Laterality: N/A;  125  . COLONOSCOPY WITH  ESOPHAGOGASTRODUODENOSCOPY (EGD) AND ESOPHAGEAL DILATION (ED)  08-19-2005   and polypectomy  . COMBINED MEDIASTINOSCOPY AND BRONCHOSCOPY  10-19-2004  dr Arlyce Dice   bx right middle lobe lesion (Epitheloid Granulomata) and left lower lobe (Hilar Adenopathy)  . TONSILLECTOMY  age 68  . TRANSTHORACIC ECHOCARDIOGRAM  09-11-2009   mild LVH/  ef 24-82%/ grade I diastolic dysfunction/  mild MR/  mild LAE/  trivial TR  . VAGINAL HYSTERECTOMY  1987    There were no vitals filed for this visit.  Subjective Assessment - 10/19/19 1558    Subjective   S: The pain level has been really down this week.    Currently in Pain?  No/denies         [facility] OT Assessment - 10/19/19 1558      Assessment   Medical Diagnosis  bilateral shoulder pain      Precautions   Precautions  None      Observation/Other Assessments   Focus on Therapeutic Outcomes (FOTO)   69% independent      Palpation   Palpation comment  Min/mod fascial restrictions noted in bilateral upper trapezius and scapularis region.       Strength   Overall Strength Comments  Assessed seated. IR/er adducted and abducted also. Test results are the same.     Strength Assessment Site  Shoulder    Right/Left Shoulder  Right;Left  Right Shoulder Flexion  5/5   4/5 previous   Right Shoulder ABduction  5/5   4/5 previous   Right Shoulder Internal Rotation  5/5   same as previous   Right Shoulder External Rotation  5/5   same as previous   Left Shoulder Flexion  5/5   4/5 previous   Left Shoulder ABduction  --   4+/5 previous   Left Shoulder Internal Rotation  5/5   same as previous   Left Shoulder External Rotation  5/5   same as previous              OT Treatments/Exercises (OP) - 10/19/19 1606      Exercises   Exercises  Neck;Shoulder      Neck Exercises: Seated   Shoulder Flexion  10 reps    Shoulder ABduction  10 reps      Shoulder Exercises: Standing   Extension  Theraband;10 reps    Theraband Level  (Shoulder Extension)  Level 2 (Red)    Row  Theraband;10 reps    Theraband Level (Shoulder Row)  Level 2 (Red)    Retraction  Theraband;10 reps    Theraband Level (Shoulder Retraction)  Level 2 (Red)      Shoulder Exercises: ROM/Strengthening   X to V Arms  10X A/ROM      Manual Therapy   Manual Therapy  Myofascial release    Manual therapy comments  myofascial release completed seperately from all other interventions    Myofascial Release  myofascial release and manual stretching to cervical region and bilateral scapular/upper trap region, scm, and platysmus region to decrease pain and fascial restrictions and improve pain free mobilty.             OT Education - 10/19/19 1635    Education Details  red scapular theraband    Person(s) Educated  Patient    Methods  Explanation;Demonstration;Verbal cues;Handout    Comprehension  Returned demonstration;Verbalized understanding       OT Short Term Goals - 10/19/19 1623      OT SHORT TERM GOAL #1   Title  Patient will be educated and independent with HEP as well as pain management techniques to faciliate her progress in therapy and allow her to experience decreased pain while completing daily and work related tasks.    Time  4    Period  Weeks    Status  Achieved    Target Date  10/29/19      OT SHORT TERM GOAL #2   Title  Patient will decrease fascial restriction to minimal amount or less in the bilateral upper trapezius and cervical region in order to increase ability to complete work tasks with less pain.    Time  4    Period  Weeks    Status  Partially Met      OT SHORT TERM GOAL #3   Title  Patient will report an average pain score of 2-3/10 in her neck and bilateral shoulders while completing daily and work related tasks.    Time  4    Period  Weeks    Status  Achieved      OT SHORT TERM GOAL #4   Title  Patient will increase shoulder and scapular strength and stability to 4+/5 overall with improved seated and  standing posture demonstrated.    Time  4    Period  Weeks    Status  Achieved  Plan - 10/19/19 1636    Clinical Impression Statement  A: Reassessment completed this session, pt has met 3/4 goals with remaining goal partially met. Pt reports she is completing work tasks with minimal discomfort and is complete HEP daily to prevent muscle tightness and pain. Reviewed HEP today, adding scapular theraband to HEP. Pt demonstrates good form. Pt is agreeable to discharge with updated HEP.    Body Structure / Function / Physical Skills  ADL;ROM;IADL;Body mechanics;Fascial restriction;Mobility;Pain;UE functional use;Strength;Flexibility    Plan  P: Discharge pt       Patient will benefit from skilled therapeutic intervention in order to improve the following deficits and impairments:   Body Structure / Function / Physical Skills: ADL, ROM, IADL, Body mechanics, Fascial restriction, Mobility, Pain, UE functional use, Strength, Flexibility       Visit Diagnosis: Other symptoms and signs involving the musculoskeletal system  Cervicalgia    Problem List Patient Active Problem List   Diagnosis Date Noted  . Personal history of sarcoidosis 02/01/2019  . Murmur, cardiac 02/01/2019  . Hx of colonic polyps 01/01/2017  . Family history of colon cancer 01/01/2017  . Prolapse of female pelvic organs 01/16/2015  . SARCOIDOSIS 09/06/2009  . GASTROESOPHAGEAL REFLUX DISEASE 09/06/2009  . CHEST DISCOMFORT 09/06/2009   Guadelupe Sabin, OTR/L  [phone] 10/19/2019, 4:41 PM  [place] [address] [place], [place], [address] Phone: [phone]   Fax:  [phone]  Name: Jeanne Chang MRN: [id number] Date of Birth: 09/19/1951   OCCUPATIONAL THERAPY DISCHARGE SUMMARY  Visits from Start of Care: 4  Current functional level related to goals / functional outcomes: See above. Pt reports significant improvement in ADL and work  task completion with minimal pain/discomfort. She is pleased with her current level and has met 3/4 goals and partially met remaining goal.    Remaining deficits: Muscle tightness in trapezius regions   Education / Equipment: HEP for cervical stretching and ROM, scapular theraband exercises Plan: Patient agrees to discharge.  Patient goals were met. Patient is being discharged due to meeting the stated rehab goals.  ?????

## 2019-10-25 DIAGNOSIS — M47812 Spondylosis without myelopathy or radiculopathy, cervical region: Secondary | ICD-10-CM | POA: Diagnosis not present

## 2019-10-25 DIAGNOSIS — M48061 Spinal stenosis, lumbar region without neurogenic claudication: Secondary | ICD-10-CM | POA: Diagnosis not present

## 2019-10-25 DIAGNOSIS — M25511 Pain in right shoulder: Secondary | ICD-10-CM | POA: Diagnosis not present

## 2019-10-25 DIAGNOSIS — R030 Elevated blood-pressure reading, without diagnosis of hypertension: Secondary | ICD-10-CM | POA: Diagnosis not present

## 2019-10-26 ENCOUNTER — Encounter (HOSPITAL_COMMUNITY): Payer: Medicare Other | Admitting: Occupational Therapy

## 2019-11-08 DIAGNOSIS — J45909 Unspecified asthma, uncomplicated: Secondary | ICD-10-CM | POA: Diagnosis not present

## 2019-11-08 DIAGNOSIS — E7849 Other hyperlipidemia: Secondary | ICD-10-CM | POA: Diagnosis not present

## 2019-11-18 DIAGNOSIS — K219 Gastro-esophageal reflux disease without esophagitis: Secondary | ICD-10-CM | POA: Diagnosis not present

## 2019-11-18 DIAGNOSIS — I10 Essential (primary) hypertension: Secondary | ICD-10-CM | POA: Diagnosis not present

## 2019-12-06 DIAGNOSIS — Z124 Encounter for screening for malignant neoplasm of cervix: Secondary | ICD-10-CM | POA: Diagnosis not present

## 2019-12-06 DIAGNOSIS — Z779 Other contact with and (suspected) exposures hazardous to health: Secondary | ICD-10-CM | POA: Diagnosis not present

## 2019-12-09 DIAGNOSIS — E7849 Other hyperlipidemia: Secondary | ICD-10-CM | POA: Diagnosis not present

## 2019-12-09 DIAGNOSIS — G894 Chronic pain syndrome: Secondary | ICD-10-CM | POA: Diagnosis not present

## 2019-12-09 DIAGNOSIS — J45909 Unspecified asthma, uncomplicated: Secondary | ICD-10-CM | POA: Diagnosis not present

## 2019-12-23 ENCOUNTER — Ambulatory Visit: Payer: Medicare Other | Attending: Internal Medicine

## 2019-12-23 ENCOUNTER — Other Ambulatory Visit: Payer: Self-pay

## 2019-12-23 DIAGNOSIS — Z20822 Contact with and (suspected) exposure to covid-19: Secondary | ICD-10-CM | POA: Diagnosis not present

## 2019-12-25 LAB — NOVEL CORONAVIRUS, NAA: SARS-CoV-2, NAA: NOT DETECTED

## 2020-01-03 DIAGNOSIS — M48061 Spinal stenosis, lumbar region without neurogenic claudication: Secondary | ICD-10-CM | POA: Diagnosis not present

## 2020-01-16 ENCOUNTER — Ambulatory Visit: Payer: Medicare Other | Attending: Internal Medicine

## 2020-01-16 ENCOUNTER — Other Ambulatory Visit: Payer: Self-pay

## 2020-01-16 DIAGNOSIS — Z23 Encounter for immunization: Secondary | ICD-10-CM | POA: Insufficient documentation

## 2020-01-16 NOTE — Progress Notes (Signed)
   Covid-19 Vaccination Clinic  Name:  Jeanne Chang    MRN: [id number] DOB: October 02, 1951  01/16/2020  Jeanne Chang was observed post Covid-19 immunization for 30 minutes based on pre-vaccination screening without incidence. She was provided with Vaccine Information Sheet and instruction to access the V-Safe system.   Jeanne Chang was instructed to call 911 with any severe reactions post vaccine: Marland Kitchen Difficulty breathing  . Swelling of your face and throat  . A fast heartbeat  . A bad rash all over your body  . Dizziness and weakness    Immunizations Administered    Name Date Dose VIS Date Route   Moderna COVID-19 Vaccine 01/16/2020  2:14 PM 0.5 mL 11/09/2019 Intramuscular   Manufacturer: Moderna   Lot: [id number]   [id number]

## 2020-01-24 DIAGNOSIS — E782 Mixed hyperlipidemia: Secondary | ICD-10-CM | POA: Diagnosis not present

## 2020-01-24 DIAGNOSIS — E039 Hypothyroidism, unspecified: Secondary | ICD-10-CM | POA: Diagnosis not present

## 2020-01-24 DIAGNOSIS — E7849 Other hyperlipidemia: Secondary | ICD-10-CM | POA: Diagnosis not present

## 2020-01-24 DIAGNOSIS — K219 Gastro-esophageal reflux disease without esophagitis: Secondary | ICD-10-CM | POA: Diagnosis not present

## 2020-01-24 DIAGNOSIS — Z0001 Encounter for general adult medical examination with abnormal findings: Secondary | ICD-10-CM | POA: Diagnosis not present

## 2020-01-24 DIAGNOSIS — G894 Chronic pain syndrome: Secondary | ICD-10-CM | POA: Diagnosis not present

## 2020-01-24 DIAGNOSIS — Z1389 Encounter for screening for other disorder: Secondary | ICD-10-CM | POA: Diagnosis not present

## 2020-01-31 DIAGNOSIS — M48061 Spinal stenosis, lumbar region without neurogenic claudication: Secondary | ICD-10-CM | POA: Diagnosis not present

## 2020-02-02 ENCOUNTER — Telehealth (INDEPENDENT_AMBULATORY_CARE_PROVIDER_SITE_OTHER): Payer: Medicare Other | Admitting: Cardiology

## 2020-02-02 ENCOUNTER — Encounter: Payer: Self-pay | Admitting: Cardiology

## 2020-02-02 VITALS — BP 112/76 | Ht 62.0 in | Wt 152.0 lb

## 2020-02-02 DIAGNOSIS — R011 Cardiac murmur, unspecified: Secondary | ICD-10-CM

## 2020-02-02 DIAGNOSIS — I5189 Other ill-defined heart diseases: Secondary | ICD-10-CM | POA: Insufficient documentation

## 2020-02-02 DIAGNOSIS — D869 Sarcoidosis, unspecified: Secondary | ICD-10-CM | POA: Diagnosis not present

## 2020-02-02 NOTE — Patient Instructions (Signed)

## 2020-02-02 NOTE — Progress Notes (Signed)
Virtual Visit via Telephone Note   This visit type was conducted due to national recommendations for restrictions regarding the COVID-19 Pandemic (e.g. social distancing) in an effort to limit this patient's exposure and mitigate transmission in our community.  Due to her co-morbid illnesses, this patient is at least at moderate risk for complications without adequate follow up.  This format is felt to be most appropriate for this patient at this time.  The patient did not have access to video technology/had technical difficulties with video requiring transitioning to audio format only (telephone).  All issues noted in this document were discussed and addressed.  No physical exam could be performed with this format.  Please refer to the patient's chart for her  consent to telehealth for [facility].   Date:  02/02/2020   ID:  Jeanne Chang, DOB [birth year], MRN [id number]  Patient Location: Home Provider Location: Home  PCP:  Redmond School, MD  Cardiologist:  Buford Dresser, MD  Electrophysiologist:  None   Evaluation Performed:  Follow-Up Visit  Chief Complaint:  Follow up  History of Present Illness:    Jeanne Chang is a 69 y.o. female with PMHx sarcoid, grade 1 diastolic dysfunction, soft systolic murmur.  The patient does not have symptoms concerning for COVID-19 infection (fever, chills, cough, or new shortness of breath).   Has stayed safe with the pandemic. Keeps very busy. No issues with sarcoid in the last year. Gained some weight with the pandemic, but has lost 5 lbs with [employer]. Has been cooking at home, walking.  Denies chest pain, shortness of breath at rest or with normal exertion. No PND, orthopnea, LE edema or unexpected weight gain. No syncope or palpitations. Had a fall but was off balance, no injury, no loss of consciousness.   Past Medical History:  Diagnosis Date  . Arthritis   . GERD (gastroesophageal reflux disease)   . History of  adenomatous polyp of colon   . History of gastroesophageal reflux (GERD)   . History of hiatal hernia   . Nocturia   . Pelvic prolapse   . Sarcoidosis of lung [facility])    dx 11/ 2005  . Vaginal vault prolapse    Past Surgical History:  Procedure Laterality Date  . ANTERIOR AND POSTERIOR REPAIR N/A 01/16/2015   Procedure: ANTERIOR VAULT REPIAR WITH KELLY PLICATION AND APICAL SACROSPINUS SUSPENSION WITH COLOPLAST AUGMENTATION/ CYSTOSCOPY COLD CUP BLADDER BIOPSY AND CAUTERIZATION AT THE BLADDER DOME;  Surgeon: Ailene Rud, MD;  Location: [facility];  Service: Urology;  Laterality: N/A;  . CHOLECYSTECTOMY  age 21  . COLONOSCOPY  11/13/2011   Procedure: COLONOSCOPY;  Surgeon: Rogene Houston, MD;  [facility];  Service: Endoscopy;  Laterality: N/A;  8:30 am  . COLONOSCOPY N/A 02/26/2017   Procedure: COLONOSCOPY;  Surgeon: Rogene Houston, MD;  [facility];  Service: Endoscopy;  Laterality: N/A;  125  . COLONOSCOPY WITH ESOPHAGOGASTRODUODENOSCOPY (EGD) AND ESOPHAGEAL DILATION (ED)  08-19-2005   and polypectomy  . COMBINED MEDIASTINOSCOPY AND BRONCHOSCOPY  10-19-2004  dr Arlyce Dice   bx right middle lobe lesion (Epitheloid Granulomata) and left lower lobe (Hilar Adenopathy)  . TONSILLECTOMY  age 8  . TRANSTHORACIC ECHOCARDIOGRAM  09-11-2009   mild LVH/  ef [id number] grade I diastolic dysfunction/  mild MR/  mild LAE/  trivial TR  . VAGINAL HYSTERECTOMY  1987     Current Meds  Medication Sig  . ALPRAZolam (XANAX) 0.5 MG tablet  Take 0.5 mg by mouth at bedtime.   . cetirizine (ZYRTEC) 10 MG tablet Take 10 mg by mouth daily.   . fluticasone (FLONASE) 50 MCG/ACT nasal spray Place 1 spray into both nostrils daily as needed for allergies or rhinitis.  . naproxen sodium (ALEVE) 220 MG tablet Take 220 mg by mouth daily as needed.  . Omega-3 Fatty Acids (FISH OIL) 1200 MG CAPS Take by mouth.  Marland Kitchen VITAMIN D, CHOLECALCIFEROL, PO Take 1 tablet by mouth daily.      Allergies:   Tetanus toxoids   Social History   Tobacco Use  . Smoking status: Never Smoker  . Smokeless tobacco: Never Used  Substance Use Topics  . Alcohol use: No  . Drug use: No     Family Hx: The patient's family history includes Aneurysm in her father; Diabetes in her mother; Heart attack in her father and mother; Hypertension in her mother; Osteoarthritis in her father. There is no history of Breast cancer.  ROS:   Please see the history of present illness.    All other systems reviewed and are negative.   Prior CV studies:   The following studies were reviewed today: Echo 2010 1. Left ventricle: The cavity size was normal. Wall thickness was  increased in a pattern of mild LVH. Systolic function was normal.  The estimated ejection fraction was in the range of 60% to 65%.  Doppler parameters are consistent with abnormal left ventricular  relaxation (grade 1 diastolic dysfunction). 2. Mitral valve: Mild regurgitation. 3. Left atrium: The atrium was mildly dilated. 4. Tricuspid valve: Trivial regurgitation. 5. Pericardium, extracardiac: There was no pericardial effusion.  Labs/Other Tests and Data Reviewed:    EKG:  An ECG dated 02/01/19 was personally reviewed today and demonstrated:  sinus bradycardia at 57 bpm  Recent Labs: No results found for requested labs within last 8760 hours.   Recent Lipid Panel No results found for: CHOL, TRIG, HDL, CHOLHDL, LDLCALC, LDLDIRECT  Wt Readings from Last 3 Encounters:  02/02/20 152 lb (68.9 kg)  02/01/19 144 lb (65.3 kg)  02/26/17 140 lb (63.5 kg)     Objective:    Vital Signs:  BP 112/76   Ht 5\' 2"  (1.575 m)   Wt 152 lb (68.9 kg)   BMI 27.80 kg/m    Speaking comfortably on the phone, no audible wheezing In no acute distress Alert and oriented Normal affect Normal speech  ASSESSMENT & PLAN:    History of sarcoid:  No flares, has been doing very well.  History of grade 1 diastolic  dysfunction: no clinical symptoms of volume overload. Blood pressure well controlled.  History of soft systolic murmur: cannot examine today, but she reports it couldn't even be heard at her last wellness visit. Asymptomatic. Follow exam.  COVID-19 Education: The signs and symptoms of COVID-19 were discussed with the patient and how to seek care for testing (follow up with PCP or arrange E-visit).  The importance of social distancing was discussed today.  Time:   Today, I have spent 7 minutes via phone with the patient with telehealth technology discussing the above problems.  Additional time for review and documentation.   Medication Adjustments/Labs and Tests Ordered: Current medicines are reviewed at length with the patient today.  Concerns regarding medicines are outlined above.   Tests Ordered: No orders of the defined types were placed in this encounter.   Medication Changes: No orders of the defined types were placed in this encounter.   Follow  Up: 1 year or sooner PRN  Signed, Buford Dresser, MD  02/02/2020 8:41 AM    [place]

## 2020-02-16 ENCOUNTER — Ambulatory Visit: Payer: Medicare Other | Attending: Internal Medicine

## 2020-02-16 DIAGNOSIS — Z23 Encounter for immunization: Secondary | ICD-10-CM

## 2020-02-16 NOTE — Progress Notes (Signed)
   Covid-19 Vaccination Clinic  Name:  Jeanne Chang    MRN: [id number] DOB: 1951-12-07  02/16/2020  [name] was observed post Covid-19 immunization for 15 minutes without incident. She was provided with Vaccine Information Sheet and instruction to access the V-Safe system.   [name] was instructed to call 911 with any severe reactions post vaccine: Marland Kitchen Difficulty breathing  . Swelling of face and throat  . A fast heartbeat  . A bad rash all over body  . Dizziness and weakness   Immunizations Administered    Name Date Dose VIS Date Route   Moderna COVID-19 Vaccine 02/16/2020  2:06 PM 0.5 mL 11/09/2019 Intramuscular   Manufacturer: Moderna   Lot: [id number]   [id number]

## 2020-03-06 DIAGNOSIS — K219 Gastro-esophageal reflux disease without esophagitis: Secondary | ICD-10-CM | POA: Diagnosis not present

## 2020-03-06 DIAGNOSIS — I10 Essential (primary) hypertension: Secondary | ICD-10-CM | POA: Diagnosis not present

## 2020-03-06 DIAGNOSIS — K5289 Other specified noninfective gastroenteritis and colitis: Secondary | ICD-10-CM | POA: Diagnosis not present

## 2020-03-06 DIAGNOSIS — E7849 Other hyperlipidemia: Secondary | ICD-10-CM | POA: Diagnosis not present

## 2020-03-08 DIAGNOSIS — E7849 Other hyperlipidemia: Secondary | ICD-10-CM | POA: Diagnosis not present

## 2020-03-08 DIAGNOSIS — J45909 Unspecified asthma, uncomplicated: Secondary | ICD-10-CM | POA: Diagnosis not present

## 2020-03-27 DIAGNOSIS — M48061 Spinal stenosis, lumbar region without neurogenic claudication: Secondary | ICD-10-CM | POA: Diagnosis not present

## 2020-04-07 DIAGNOSIS — G894 Chronic pain syndrome: Secondary | ICD-10-CM | POA: Diagnosis not present

## 2020-04-07 DIAGNOSIS — J45909 Unspecified asthma, uncomplicated: Secondary | ICD-10-CM | POA: Diagnosis not present

## 2020-04-07 DIAGNOSIS — E7849 Other hyperlipidemia: Secondary | ICD-10-CM | POA: Diagnosis not present

## 2020-04-11 DIAGNOSIS — B379 Candidiasis, unspecified: Secondary | ICD-10-CM | POA: Diagnosis not present

## 2020-04-19 DIAGNOSIS — H25013 Cortical age-related cataract, bilateral: Secondary | ICD-10-CM | POA: Diagnosis not present

## 2020-04-19 DIAGNOSIS — H2513 Age-related nuclear cataract, bilateral: Secondary | ICD-10-CM | POA: Diagnosis not present

## 2020-04-19 DIAGNOSIS — H524 Presbyopia: Secondary | ICD-10-CM | POA: Diagnosis not present

## 2020-04-19 DIAGNOSIS — H5203 Hypermetropia, bilateral: Secondary | ICD-10-CM | POA: Diagnosis not present

## 2020-04-27 DIAGNOSIS — M47812 Spondylosis without myelopathy or radiculopathy, cervical region: Secondary | ICD-10-CM | POA: Diagnosis not present

## 2020-04-27 DIAGNOSIS — M48061 Spinal stenosis, lumbar region without neurogenic claudication: Secondary | ICD-10-CM | POA: Diagnosis not present

## 2020-04-27 DIAGNOSIS — M25511 Pain in right shoulder: Secondary | ICD-10-CM | POA: Diagnosis not present

## 2020-07-07 DIAGNOSIS — J45909 Unspecified asthma, uncomplicated: Secondary | ICD-10-CM | POA: Diagnosis not present

## 2020-07-07 DIAGNOSIS — E7849 Other hyperlipidemia: Secondary | ICD-10-CM | POA: Diagnosis not present

## 2020-07-07 DIAGNOSIS — G894 Chronic pain syndrome: Secondary | ICD-10-CM | POA: Diagnosis not present

## 2020-07-31 DIAGNOSIS — M48061 Spinal stenosis, lumbar region without neurogenic claudication: Secondary | ICD-10-CM | POA: Diagnosis not present

## 2020-08-07 DIAGNOSIS — F5101 Primary insomnia: Secondary | ICD-10-CM | POA: Diagnosis not present

## 2020-08-21 DIAGNOSIS — M47812 Spondylosis without myelopathy or radiculopathy, cervical region: Secondary | ICD-10-CM | POA: Diagnosis not present

## 2020-08-21 DIAGNOSIS — M25511 Pain in right shoulder: Secondary | ICD-10-CM | POA: Diagnosis not present

## 2020-08-21 DIAGNOSIS — M48061 Spinal stenosis, lumbar region without neurogenic claudication: Secondary | ICD-10-CM | POA: Diagnosis not present

## 2020-09-01 ENCOUNTER — Other Ambulatory Visit: Payer: Self-pay | Admitting: Obstetrics and Gynecology

## 2020-09-01 DIAGNOSIS — Z1231 Encounter for screening mammogram for malignant neoplasm of breast: Secondary | ICD-10-CM

## 2020-09-07 DIAGNOSIS — E7849 Other hyperlipidemia: Secondary | ICD-10-CM | POA: Diagnosis not present

## 2020-09-07 DIAGNOSIS — J45909 Unspecified asthma, uncomplicated: Secondary | ICD-10-CM | POA: Diagnosis not present

## 2020-10-07 DIAGNOSIS — J45909 Unspecified asthma, uncomplicated: Secondary | ICD-10-CM | POA: Diagnosis not present

## 2020-10-07 DIAGNOSIS — E7849 Other hyperlipidemia: Secondary | ICD-10-CM | POA: Diagnosis not present

## 2020-10-13 ENCOUNTER — Ambulatory Visit
Admission: RE | Admit: 2020-10-13 | Discharge: 2020-10-13 | Disposition: A | Discharge status: Home / Self Care | Payer: Medicare Other | Source: Ambulatory Visit | Attending: Obstetrics and Gynecology | Admitting: Obstetrics and Gynecology

## 2020-10-13 ENCOUNTER — Ambulatory Visit: Payer: Medicare Other

## 2020-10-13 ENCOUNTER — Other Ambulatory Visit: Payer: Self-pay

## 2020-10-13 DIAGNOSIS — Z1231 Encounter for screening mammogram for malignant neoplasm of breast: Secondary | ICD-10-CM

## 2020-11-22 ENCOUNTER — Ambulatory Visit: Payer: Medicare Other

## 2020-12-06 DIAGNOSIS — Z779 Other contact with and (suspected) exposures hazardous to health: Secondary | ICD-10-CM | POA: Diagnosis not present

## 2020-12-15 ENCOUNTER — Ambulatory Visit: Payer: Medicare Other | Admitting: Cardiology

## 2021-01-06 DIAGNOSIS — E7849 Other hyperlipidemia: Secondary | ICD-10-CM | POA: Diagnosis not present

## 2021-01-06 DIAGNOSIS — J45909 Unspecified asthma, uncomplicated: Secondary | ICD-10-CM | POA: Diagnosis not present

## 2021-01-23 ENCOUNTER — Other Ambulatory Visit: Payer: Self-pay

## 2021-01-23 ENCOUNTER — Encounter: Payer: Self-pay | Admitting: Cardiology

## 2021-01-23 ENCOUNTER — Ambulatory Visit: Payer: Medicare Other | Admitting: Cardiology

## 2021-01-23 VITALS — BP 144/68 | HR 48 | Ht 62.0 in | Wt 149.2 lb

## 2021-01-23 DIAGNOSIS — R011 Cardiac murmur, unspecified: Secondary | ICD-10-CM | POA: Diagnosis not present

## 2021-01-23 DIAGNOSIS — I5189 Other ill-defined heart diseases: Secondary | ICD-10-CM

## 2021-01-23 DIAGNOSIS — D869 Sarcoidosis, unspecified: Secondary | ICD-10-CM

## 2021-01-23 DIAGNOSIS — R030 Elevated blood-pressure reading, without diagnosis of hypertension: Secondary | ICD-10-CM

## 2021-01-23 DIAGNOSIS — Z7189 Other specified counseling: Secondary | ICD-10-CM | POA: Diagnosis not present

## 2021-01-23 NOTE — Patient Instructions (Addendum)
Medication Instructions:  Your Physician recommend you continue on your current medication as directed.    *If you need a refill on your cardiac medications before your next appointment, please call your pharmacy*   Lab Work: None   Testing/Procedures: None   Follow-Up: At [facility], you and your health needs are our priority.  As part of our continuing mission to provide you with exceptional heart care, we have created designated Provider Care Teams.  These Care Teams include your primary Cardiologist (physician) and Advanced Practice Providers (APPs -  Physician Assistants and Nurse Practitioners) who all work together to provide you with the care you need, when you need it.  We recommend signing up for the patient portal called "MyChart".  Sign up information is provided on this After Visit Summary.  MyChart is used to connect with patients for Virtual Visits (Telemedicine).  Patients are able to view lab/test results, encounter notes, upcoming appointments, etc.  Non-urgent messages can be sent to your provider as well.   To learn more about what you can do with MyChart, go to [email or web address].    Your next appointment:   1 year(s)  The format for your next appointment:   In Person  Provider:   Buford Dresser, MD   Other Instructions how to check blood pressure:  -sit comfortably in a chair, feet uncrossed and flat on floor, for 5-10 minutes  -arm ideally should rest at the level of the heart. However, arm should be relaxed and not tense (for example, do not hold the arm up unsupported)  -avoid exercise, caffeine, and tobacco for at least 30 minutes prior to BP reading  -don't take BP cuff reading over clothes (always place on skin directly)  -I prefer to know how well the medication is working, so I would like you to take your readings 1-2 hours after taking your blood pressure medication if possible

## 2021-01-23 NOTE — Progress Notes (Signed)
Cardiology Office Note:    Date:  01/23/2021   ID:  Jeanne Chang Jeanne Chang, Jeanne Chang 28-Jan-1951, MRN [id number]  PCP:  Jeanne School, MD  Cardiologist:  Jeanne Dresser, MD  Referring MD: Jeanne Nigh, MD   Chief Complaint:  Follow up  History of Present Illness:    Jeanne Chang is a 70 y.o. female with PMHx sarcoid, grade 1 diastolic dysfunction, soft systolic murmur.  Today: Has done well since I last saw her, no concerns about her heart. Sent back to cardiology by her GYN for follow up. She denies any concerns.  Exercises, eats well. Staying healthy. Walks twice a day, 15-20 minutes each time. When weather is bad she does indoor walking videos.  Doesn't check BP at home any more. Had been normal for a long time. Has never been on medication for blood pressure. Does have siblings with high blood pressure.  From visit 12/06/20 with Dr. Radene Chang, her BP was 148/76. This was the first time she was told it was elevated.   Denies chest pain, shortness of breath at rest or with normal exertion. No PND, orthopnea, LE edema or unexpected weight gain. No syncope or palpitations.  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 Did come back positive once but was completely asymptomatic.Fully vaccinate/boosted.  Denies chest pain, shortness of breath at rest or with normal exertion. No PND, orthopnea, LE edema or unexpected weight gain. No syncope or palpitations.  Past Medical History:  Diagnosis Date  . Arthritis   . GERD (gastroesophageal reflux disease)   . History of adenomatous polyp of colon   . History of gastroesophageal reflux (GERD)   . History of hiatal hernia   . Nocturia   . Pelvic prolapse   . Sarcoidosis of lung [facility])    dx 11/ 2005  . Vaginal vault prolapse    Past Surgical History:  Procedure Laterality Date  . ANTERIOR AND POSTERIOR REPAIR N/A 01/16/2015   Procedure: ANTERIOR VAULT REPIAR WITH KELLY PLICATION AND APICAL SACROSPINUS SUSPENSION  WITH COLOPLAST AUGMENTATION/ CYSTOSCOPY COLD CUP BLADDER BIOPSY AND CAUTERIZATION AT THE BLADDER DOME;  Surgeon: Jeanne Rud, MD;  Location: [facility];  Service: Urology;  Laterality: N/A;  . CHOLECYSTECTOMY  age 56  . COLONOSCOPY  11/13/2011   Procedure: COLONOSCOPY;  Surgeon: Jeanne Houston, MD;  [facility];  Service: Endoscopy;  Laterality: N/A;  8:30 am  . COLONOSCOPY N/A 02/26/2017   Procedure: COLONOSCOPY;  Surgeon: Jeanne Houston, MD;  [facility];  Service: Endoscopy;  Laterality: N/A;  125  . COLONOSCOPY WITH ESOPHAGOGASTRODUODENOSCOPY (EGD) AND ESOPHAGEAL DILATION (ED)  08-19-2005   and polypectomy  . COMBINED MEDIASTINOSCOPY AND BRONCHOSCOPY  10-19-2004  dr Jeanne Chang   bx right middle lobe lesion (Epitheloid Granulomata) and left lower lobe (Hilar Adenopathy)  . TONSILLECTOMY  age 36  . TRANSTHORACIC ECHOCARDIOGRAM  09-11-2009   mild LVH/  ef 62-37%/ grade I diastolic dysfunction/  mild MR/  mild LAE/  trivial TR  . VAGINAL HYSTERECTOMY  1987     Current Meds  Medication Sig  . ALPRAZolam (XANAX) 0.5 MG tablet Take 0.5 mg by mouth at bedtime.  Marland Kitchen aspirin EC 81 MG tablet Take 81 mg by mouth daily. Swallow whole.  . gabapentin (NEURONTIN) 300 MG capsule prn  . loratadine (CLARITIN) 10 MG tablet Take by mouth.  . naproxen sodium (ALEVE) 220 MG tablet Take 220 mg by mouth daily as needed.  . Omega-3 Fatty Acids (  FISH OIL) 1200 MG CAPS Take by mouth.  Marland Kitchen VITAMIN D, CHOLECALCIFEROL, PO Take 1 tablet by mouth daily.     Allergies:   Tetanus toxoids   Social History   Tobacco Use  . Smoking status: Never Smoker  . Smokeless tobacco: Never Used  Substance Use Topics  . Alcohol use: No  . Drug use: No     Family Hx: The patient's family history includes Aneurysm in her father; Diabetes in her mother; Heart attack in her father and mother; Hypertension in her mother; Osteoarthritis in her father. There is no history of Breast  cancer.  ROS:   Please see the history of present illness.    All other systems reviewed and are negative.   Prior CV studies:   The following studies were reviewed today: Echo 2010 1. Left ventricle: The cavity size was normal. Wall thickness was  increased in a pattern of mild LVH. Systolic function was normal.  The estimated ejection fraction was in the range of 60% to 65%.  Doppler parameters are consistent with abnormal left ventricular  relaxation (grade 1 diastolic dysfunction). 2. Mitral valve: Mild regurgitation. 3. Left atrium: The atrium was mildly dilated. 4. Tricuspid valve: Trivial regurgitation. 5. Pericardium, extracardiac: There was no pericardial effusion.  Labs/Other Tests and Data Reviewed:    EKG:  An ECG dated 01/23/21 was personally reviewed today and demonstrated:  sinus bradycardia at 48 bpm  Recent Labs: No results found for requested labs within last 8760 hours.   Recent Lipid Panel No results found for: CHOL, TRIG, HDL, CHOLHDL, LDLCALC, LDLDIRECT  Wt Readings from Last 3 Encounters:  01/23/21 149 lb 3.2 oz (67.7 kg)  02/02/20 152 lb (68.9 kg)  02/01/19 144 lb (65.3 kg)     Objective:    Vital Signs:  BP (!) 144/68 (BP Location: Right Arm, Patient Position: Sitting, Cuff Size: Normal)   Pulse (!) 48   Ht 5\' 2"  (1.575 m)   Wt 149 lb 3.2 oz (67.7 kg)   SpO2 97%   BMI 27.29 kg/m    GEN: Well nourished, well developed in no acute distress HEENT: Normal, moist mucous membranes NECK: No JVD CARDIAC: regular rhythm, normal S1 and S2, no rubs or gallops. 1/6 systolic murmur. VASCULAR: Radial and DP pulses 2+ bilaterally. No carotid bruits RESPIRATORY:  Clear to auscultation without rales, wheezing or rhonchi  ABDOMEN: Soft, non-tender, non-distended MUSCULOSKELETAL:  Ambulates independently SKIN: Warm and dry, no edema NEUROLOGIC:  Alert and oriented x 3. No focal neuro deficits noted. PSYCHIATRIC:  Normal affect    ASSESSMENT & PLAN:    1. Elevated blood pressure reading   2. Sarcoidosis   3. Murmur, cardiac   4. Diastolic dysfunction   5. Counseling on health promotion and disease prevention    History of sarcoid:  No flares, has been doing very well.  History of grade 1 diastolic dysfunction: no clinical symptoms of volume overload. Euvolemic on exam today  History of soft systolic murmur: very soft, asymptomatic  Elevated blood pressure reading: this is recent for her -she will check BP measurements at home and send to me via mychart -we discussed options for medications if needed. Discussed multiple classes. She would prefer something that has no impact on her kidneys, so would trial low dose amlodipine first, then consider thiazide (she did not want to start with thiazide) -if BP <140/90 on average, would continue to monitor without starting medication  CV risk counseling and prevention: -recommend heart healthy/Mediterranean  diet, with whole grains, fruits, vegetable, fish, lean meats, nuts, and olive oil. Limit salt. -recommend moderate walking, 3-5 times/week for 30-50 minutes each session. Aim for at least 150 minutes.week. Goal should be pace of 3 miles/hours, or walking 1.5 miles in 30 minutes -recommend avoidance of tobacco products. Avoid excess alcohol. -ASCVD risk score: The ASCVD Risk score Mikey Bussing DC Jr., et al., 2013) failed to calculate for the following reasons:   Cannot find a previous HDL lab   Cannot find a previous total cholesterol lab  -she does not meet recommendations for aspirin for prevention, but she wishes to continue taking.  Follow up: if she does not need to start an antihypertensive, follow up in 1 year. I will follow her BP with her on mychart.  Medication Adjustments/Labs and Tests Ordered: Current medicines are reviewed at length with the patient today.  Concerns regarding medicines are outlined above.   Tests Ordered: Orders Placed This Encounter   Procedures  . EKG 12-Lead    Medication Changes: No orders of the defined types were placed in this encounter.  Patient Instructions  Medication Instructions:  Your Physician recommend you continue on your current medication as directed.    *If you need a refill on your cardiac medications before your next appointment, please call your pharmacy*   Lab Work: None   Testing/Procedures: None   Follow-Up: At [facility], you and your health needs are our priority.  As part of our continuing mission to provide you with exceptional heart care, we have created designated Provider Care Teams.  These Care Teams include your primary Cardiologist (physician) and Advanced Practice Providers (APPs -  Physician Assistants and Nurse Practitioners) who all work together to provide you with the care you need, when you need it.  We recommend signing up for the patient portal called "MyChart".  Sign up information is provided on this After Visit Summary.  MyChart is used to connect with patients for Virtual Visits (Telemedicine).  Patients are able to view lab/test results, encounter notes, upcoming appointments, etc.  Non-urgent messages can be sent to your provider as well.   To learn more about what you can do with MyChart, go to [email or web address].    Your next appointment:   1 year(s)  The format for your next appointment:   In Person  Provider:   Buford Dresser, MD   Other Instructions how to check blood pressure:  -sit comfortably in a chair, feet uncrossed and flat on floor, for 5-10 minutes  -arm ideally should rest at the level of the heart. However, arm should be relaxed and not tense (for example, do not hold the arm up unsupported)  -avoid exercise, caffeine, and tobacco for at least 30 minutes prior to BP reading  -don't take BP cuff reading over clothes (always place on skin directly)  -I prefer to know how well the medication is working, so I would like you  to take your readings 1-2 hours after taking your blood pressure medication if possible   Signed, Jeanne Dresser, MD  01/23/2021 5:56 PM    [place]

## 2021-02-08 DIAGNOSIS — M47812 Spondylosis without myelopathy or radiculopathy, cervical region: Secondary | ICD-10-CM | POA: Diagnosis not present

## 2021-02-08 DIAGNOSIS — M48061 Spinal stenosis, lumbar region without neurogenic claudication: Secondary | ICD-10-CM | POA: Diagnosis not present

## 2021-02-08 DIAGNOSIS — M25519 Pain in unspecified shoulder: Secondary | ICD-10-CM | POA: Diagnosis not present

## 2021-02-08 DIAGNOSIS — M25511 Pain in right shoulder: Secondary | ICD-10-CM | POA: Diagnosis not present

## 2021-02-19 DIAGNOSIS — E7849 Other hyperlipidemia: Secondary | ICD-10-CM | POA: Diagnosis not present

## 2021-02-19 DIAGNOSIS — Z0000 Encounter for general adult medical examination without abnormal findings: Secondary | ICD-10-CM | POA: Diagnosis not present

## 2021-02-19 DIAGNOSIS — I10 Essential (primary) hypertension: Secondary | ICD-10-CM | POA: Diagnosis not present

## 2021-02-19 DIAGNOSIS — Z0001 Encounter for general adult medical examination with abnormal findings: Secondary | ICD-10-CM | POA: Diagnosis not present

## 2021-02-19 DIAGNOSIS — E0590 Thyrotoxicosis, unspecified without thyrotoxic crisis or storm: Secondary | ICD-10-CM | POA: Diagnosis not present

## 2021-02-26 DIAGNOSIS — M5416 Radiculopathy, lumbar region: Secondary | ICD-10-CM | POA: Diagnosis not present

## 2021-03-07 DIAGNOSIS — I10 Essential (primary) hypertension: Secondary | ICD-10-CM | POA: Diagnosis not present

## 2021-03-07 DIAGNOSIS — E7849 Other hyperlipidemia: Secondary | ICD-10-CM | POA: Diagnosis not present

## 2021-03-28 DIAGNOSIS — M25511 Pain in right shoulder: Secondary | ICD-10-CM | POA: Diagnosis not present

## 2021-03-28 DIAGNOSIS — M48061 Spinal stenosis, lumbar region without neurogenic claudication: Secondary | ICD-10-CM | POA: Diagnosis not present

## 2021-03-28 DIAGNOSIS — M47812 Spondylosis without myelopathy or radiculopathy, cervical region: Secondary | ICD-10-CM | POA: Diagnosis not present

## 2021-04-07 DIAGNOSIS — I10 Essential (primary) hypertension: Secondary | ICD-10-CM | POA: Diagnosis not present

## 2021-04-07 DIAGNOSIS — E7849 Other hyperlipidemia: Secondary | ICD-10-CM | POA: Diagnosis not present

## 2021-04-24 DIAGNOSIS — Z23 Encounter for immunization: Secondary | ICD-10-CM | POA: Diagnosis not present

## 2021-04-24 DIAGNOSIS — Z13220 Encounter for screening for lipoid disorders: Secondary | ICD-10-CM | POA: Diagnosis not present

## 2021-04-24 DIAGNOSIS — Z0001 Encounter for general adult medical examination with abnormal findings: Secondary | ICD-10-CM | POA: Diagnosis not present

## 2021-04-24 DIAGNOSIS — K219 Gastro-esophageal reflux disease without esophagitis: Secondary | ICD-10-CM | POA: Diagnosis not present

## 2021-04-24 DIAGNOSIS — Z1389 Encounter for screening for other disorder: Secondary | ICD-10-CM | POA: Diagnosis not present

## 2021-04-24 DIAGNOSIS — I10 Essential (primary) hypertension: Secondary | ICD-10-CM | POA: Diagnosis not present

## 2021-04-24 DIAGNOSIS — E039 Hypothyroidism, unspecified: Secondary | ICD-10-CM | POA: Diagnosis not present

## 2021-04-30 DIAGNOSIS — Z23 Encounter for immunization: Secondary | ICD-10-CM | POA: Diagnosis not present

## 2021-04-30 DIAGNOSIS — I10 Essential (primary) hypertension: Secondary | ICD-10-CM | POA: Diagnosis not present

## 2021-04-30 DIAGNOSIS — Z13220 Encounter for screening for lipoid disorders: Secondary | ICD-10-CM | POA: Diagnosis not present

## 2021-05-23 DIAGNOSIS — H5203 Hypermetropia, bilateral: Secondary | ICD-10-CM | POA: Diagnosis not present

## 2021-05-23 DIAGNOSIS — H524 Presbyopia: Secondary | ICD-10-CM | POA: Diagnosis not present

## 2021-05-23 DIAGNOSIS — H2513 Age-related nuclear cataract, bilateral: Secondary | ICD-10-CM | POA: Diagnosis not present

## 2021-05-23 DIAGNOSIS — H25013 Cortical age-related cataract, bilateral: Secondary | ICD-10-CM | POA: Diagnosis not present

## 2021-06-04 DIAGNOSIS — M5416 Radiculopathy, lumbar region: Secondary | ICD-10-CM | POA: Diagnosis not present

## 2021-06-07 DIAGNOSIS — E782 Mixed hyperlipidemia: Secondary | ICD-10-CM | POA: Diagnosis not present

## 2021-06-07 DIAGNOSIS — I10 Essential (primary) hypertension: Secondary | ICD-10-CM | POA: Diagnosis not present

## 2021-07-11 DIAGNOSIS — M5416 Radiculopathy, lumbar region: Secondary | ICD-10-CM | POA: Diagnosis not present

## 2021-07-11 DIAGNOSIS — M48061 Spinal stenosis, lumbar region without neurogenic claudication: Secondary | ICD-10-CM | POA: Diagnosis not present

## 2021-07-11 DIAGNOSIS — M47812 Spondylosis without myelopathy or radiculopathy, cervical region: Secondary | ICD-10-CM | POA: Diagnosis not present

## 2021-08-16 DIAGNOSIS — M5416 Radiculopathy, lumbar region: Secondary | ICD-10-CM | POA: Diagnosis not present

## 2021-08-16 DIAGNOSIS — M48061 Spinal stenosis, lumbar region without neurogenic claudication: Secondary | ICD-10-CM | POA: Diagnosis not present

## 2021-08-16 DIAGNOSIS — R030 Elevated blood-pressure reading, without diagnosis of hypertension: Secondary | ICD-10-CM | POA: Diagnosis not present

## 2021-08-16 DIAGNOSIS — M25511 Pain in right shoulder: Secondary | ICD-10-CM | POA: Diagnosis not present

## 2021-08-25 IMAGING — MG DIGITAL SCREENING BILAT W/ TOMO W/ CAD
6 of 10 series · 6 of 30 positions shown · non-contrast
Comparison: Previous exam(s).

CLINICAL DATA: Screening.

EXAM:
DIGITAL SCREENING BILATERAL MAMMOGRAM WITH TOMO AND CAD

[R MLO synth-2D]
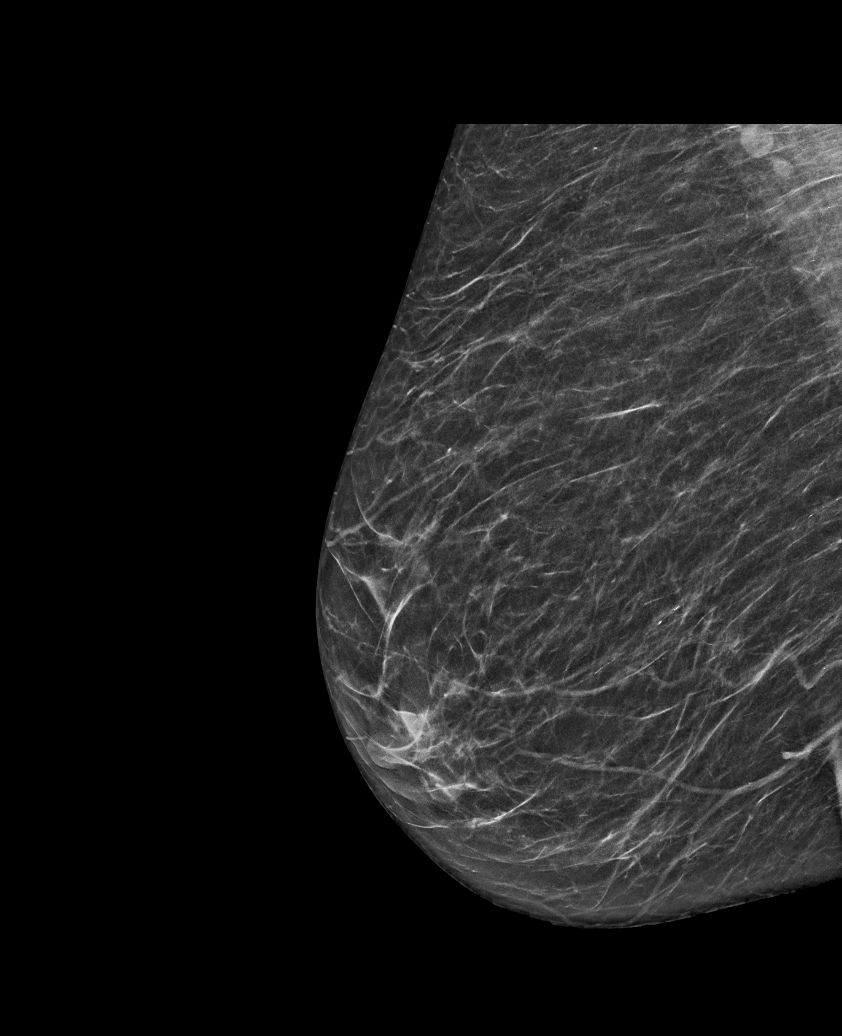

[L CC synth-2D]
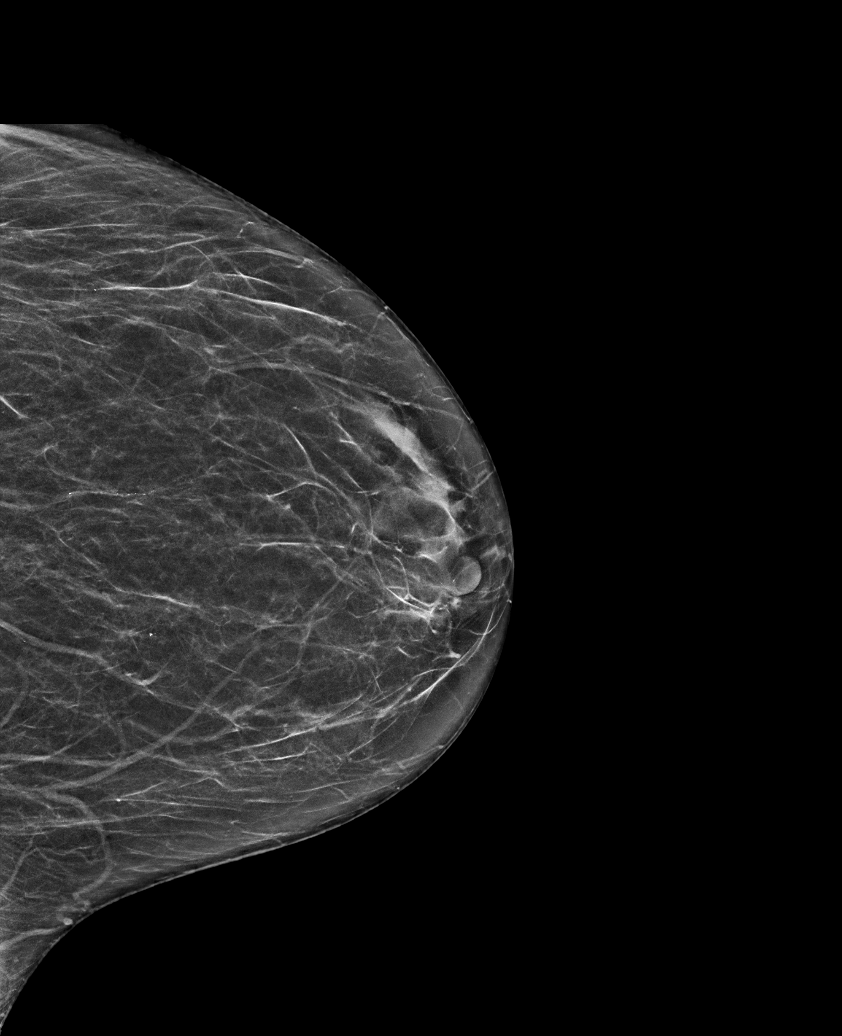

[L MLO synth-2D]
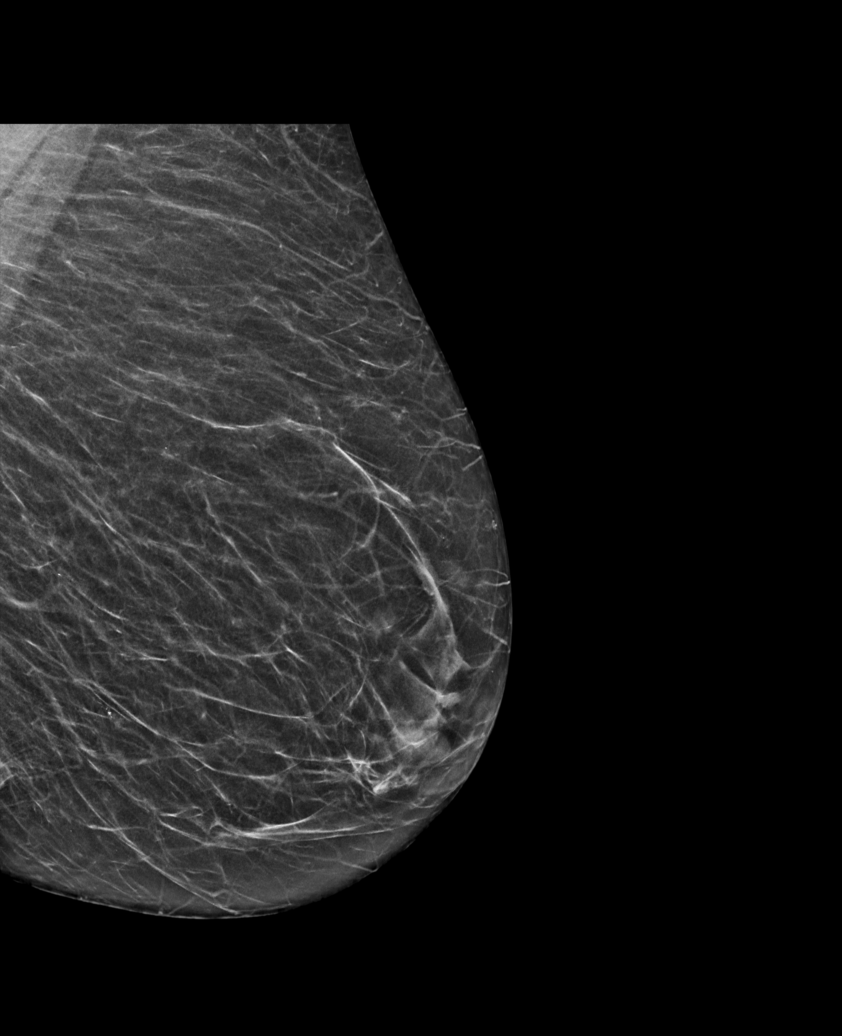

[R CC synth-2D (1 of 2)]
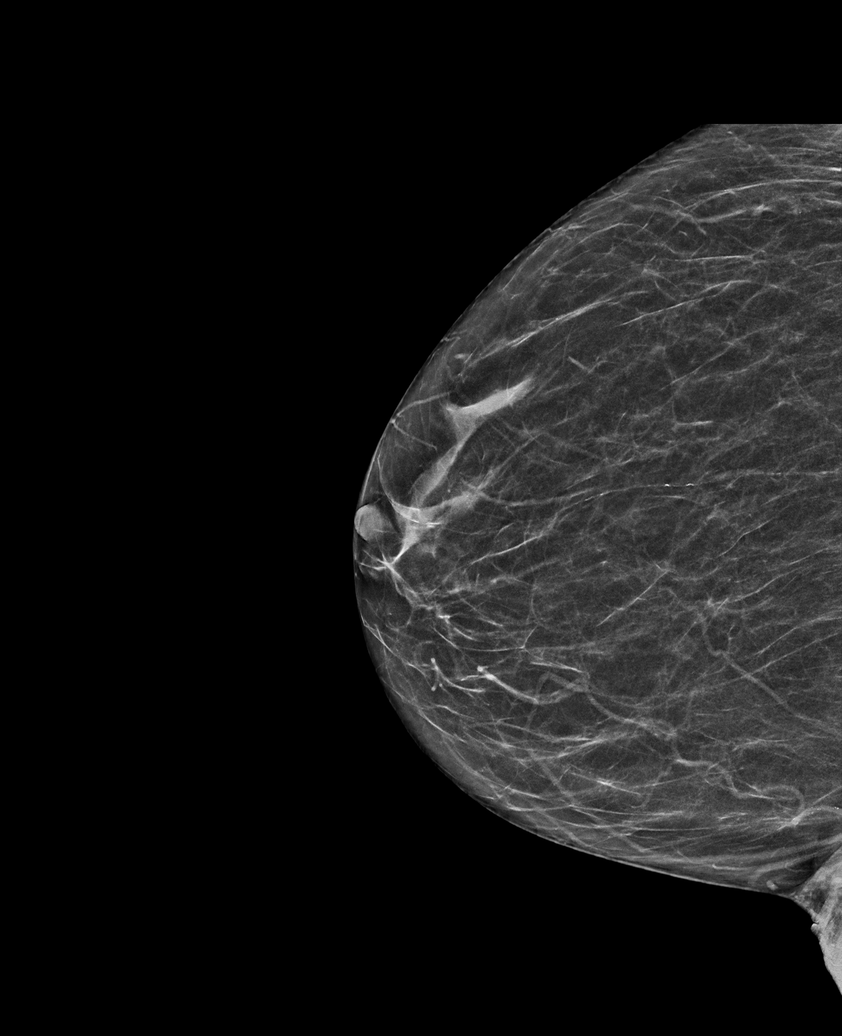

[R CC synth-2D (2 of 2)]
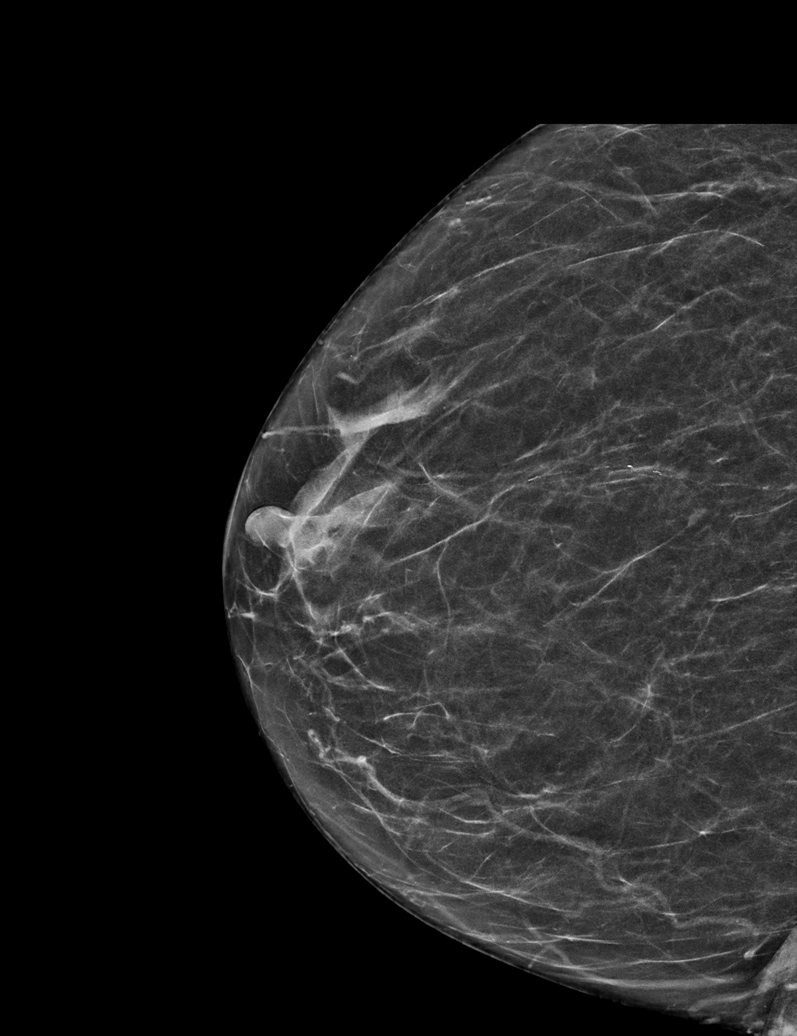

[L CC tomo · tomo slice 33/64.0]
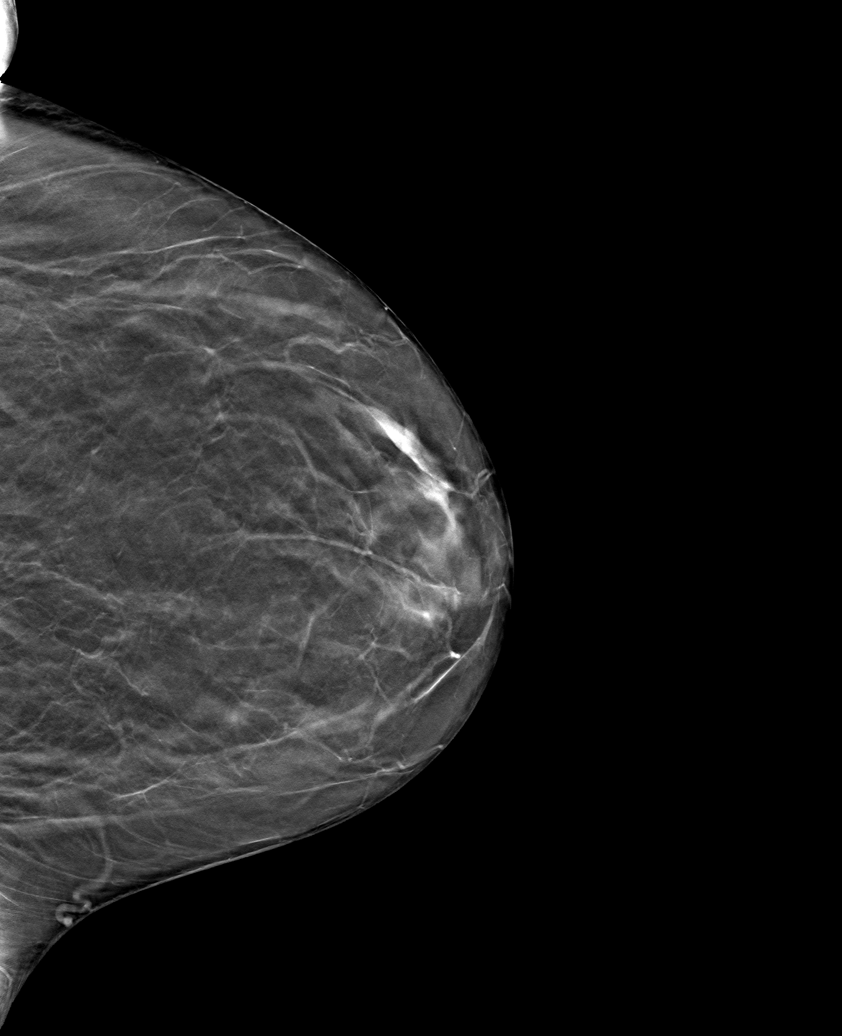

[6 of 30 positions shown; findings below may reference images not displayed]

ACR Breast Density Category b: There are scattered areas of
fibroglandular density.
FINDINGS: There are no findings suspicious for malignancy. Images were
processed with CAD.
IMPRESSION: No mammographic evidence of malignancy. A result letter of this
screening mammogram will be mailed directly to the patient.

RECOMMENDATION:
Screening mammogram in one year. (Code:[id number])

BI-RADS CATEGORY  1: Negative.

## 2021-09-10 ENCOUNTER — Other Ambulatory Visit: Payer: Self-pay | Admitting: Obstetrics and Gynecology

## 2021-09-10 DIAGNOSIS — M5416 Radiculopathy, lumbar region: Secondary | ICD-10-CM | POA: Diagnosis not present

## 2021-09-10 DIAGNOSIS — Z1231 Encounter for screening mammogram for malignant neoplasm of breast: Secondary | ICD-10-CM

## 2021-09-24 DIAGNOSIS — M79642 Pain in left hand: Secondary | ICD-10-CM | POA: Diagnosis not present

## 2021-10-03 DIAGNOSIS — M5416 Radiculopathy, lumbar region: Secondary | ICD-10-CM | POA: Diagnosis not present

## 2021-10-03 DIAGNOSIS — M25511 Pain in right shoulder: Secondary | ICD-10-CM | POA: Diagnosis not present

## 2021-10-03 DIAGNOSIS — M48061 Spinal stenosis, lumbar region without neurogenic claudication: Secondary | ICD-10-CM | POA: Diagnosis not present

## 2021-10-03 DIAGNOSIS — R030 Elevated blood-pressure reading, without diagnosis of hypertension: Secondary | ICD-10-CM | POA: Diagnosis not present

## 2021-10-08 DIAGNOSIS — Z23 Encounter for immunization: Secondary | ICD-10-CM | POA: Diagnosis not present

## 2021-10-10 DIAGNOSIS — M79642 Pain in left hand: Secondary | ICD-10-CM | POA: Diagnosis not present

## 2021-10-10 DIAGNOSIS — S52612A Displaced fracture of left ulna styloid process, initial encounter for closed fracture: Secondary | ICD-10-CM | POA: Diagnosis not present

## 2021-10-15 ENCOUNTER — Ambulatory Visit
Admission: RE | Admit: 2021-10-15 | Discharge: 2021-10-15 | Disposition: A | Discharge status: Home / Self Care | Payer: Medicare Other | Source: Ambulatory Visit | Attending: Obstetrics and Gynecology | Admitting: Obstetrics and Gynecology

## 2021-10-15 ENCOUNTER — Other Ambulatory Visit: Payer: Self-pay

## 2021-10-15 DIAGNOSIS — Z1231 Encounter for screening mammogram for malignant neoplasm of breast: Secondary | ICD-10-CM

## 2021-10-30 DIAGNOSIS — S52612A Displaced fracture of left ulna styloid process, initial encounter for closed fracture: Secondary | ICD-10-CM | POA: Diagnosis not present

## 2021-10-30 DIAGNOSIS — M79642 Pain in left hand: Secondary | ICD-10-CM | POA: Diagnosis not present

## 2021-11-19 DIAGNOSIS — M25511 Pain in right shoulder: Secondary | ICD-10-CM | POA: Diagnosis not present

## 2021-11-19 DIAGNOSIS — M48061 Spinal stenosis, lumbar region without neurogenic claudication: Secondary | ICD-10-CM | POA: Diagnosis not present

## 2021-12-17 DIAGNOSIS — M48061 Spinal stenosis, lumbar region without neurogenic claudication: Secondary | ICD-10-CM | POA: Diagnosis not present

## 2021-12-20 DIAGNOSIS — Z6824 Body mass index (BMI) 24.0-24.9, adult: Secondary | ICD-10-CM | POA: Diagnosis not present

## 2021-12-20 DIAGNOSIS — Z0000 Encounter for general adult medical examination without abnormal findings: Secondary | ICD-10-CM | POA: Diagnosis not present

## 2021-12-20 DIAGNOSIS — F5101 Primary insomnia: Secondary | ICD-10-CM | POA: Diagnosis not present

## 2021-12-20 DIAGNOSIS — E782 Mixed hyperlipidemia: Secondary | ICD-10-CM | POA: Diagnosis not present

## 2021-12-20 DIAGNOSIS — G894 Chronic pain syndrome: Secondary | ICD-10-CM | POA: Diagnosis not present

## 2021-12-20 DIAGNOSIS — R5383 Other fatigue: Secondary | ICD-10-CM | POA: Diagnosis not present

## 2021-12-20 DIAGNOSIS — I10 Essential (primary) hypertension: Secondary | ICD-10-CM | POA: Diagnosis not present

## 2022-01-08 DIAGNOSIS — E782 Mixed hyperlipidemia: Secondary | ICD-10-CM | POA: Diagnosis not present

## 2022-01-08 DIAGNOSIS — I10 Essential (primary) hypertension: Secondary | ICD-10-CM | POA: Diagnosis not present

## 2022-01-14 DIAGNOSIS — M48061 Spinal stenosis, lumbar region without neurogenic claudication: Secondary | ICD-10-CM | POA: Diagnosis not present

## 2022-01-14 DIAGNOSIS — M25511 Pain in right shoulder: Secondary | ICD-10-CM | POA: Diagnosis not present

## 2022-01-14 DIAGNOSIS — R030 Elevated blood-pressure reading, without diagnosis of hypertension: Secondary | ICD-10-CM | POA: Diagnosis not present

## 2022-01-18 NOTE — Progress Notes (Signed)
Cardiology Office Note:    Date:  01/21/2022   ID:  Jeanne Chang, [place] 04/07/1951, MRN [id number]  PCP:  Jeanne School, MD  Cardiologist:  Buford Dresser, MD  Referring MD: Jeanne School, MD   Chief Complaint:  Follow up  History of Present Illness:    Jeanne Chang is a 71 y.o. female with PMHx sarcoid, grade 1 diastolic dysfunction, soft systolic murmur.  Today: Overall, she is feeling great, with no new complaints. She does not monitor her BP at home, and denies any high readings at other clinic visits.  For exercise she consistently walks 2 miles a day, or she uses exercise tapes indoors during inclement weather.  She continues to successfully follow a healthy diet. Often she has fish, chicken, [place], and vegetables. Avoids red meats.  Her regimen has included Zetia for 2-3 years.  She denies any palpitations, chest pain, shortness of breath, or peripheral edema. No lightheadedness, headaches, syncope, orthopnea, or PND.   Past Medical History:  Diagnosis Date   Arthritis    GERD (gastroesophageal reflux disease)    History of adenomatous polyp of colon    History of gastroesophageal reflux (GERD)    History of hiatal hernia    Nocturia    Pelvic prolapse    Sarcoidosis of lung ([place])    dx 11/ 2005   Vaginal vault prolapse    Past Surgical History:  Procedure Laterality Date   ANTERIOR AND POSTERIOR REPAIR N/A 01/16/2015   Procedure: ANTERIOR VAULT REPIAR WITH KELLY PLICATION AND APICAL SACROSPINUS SUSPENSION WITH COLOPLAST AUGMENTATION/ CYSTOSCOPY COLD CUP BLADDER BIOPSY AND CAUTERIZATION AT THE BLADDER DOME;  Surgeon: Jeanne Rud, MD;  Location: [facility];  Service: Urology;  Laterality: N/A;   CHOLECYSTECTOMY  age 3   COLONOSCOPY  11/13/2011   Procedure: COLONOSCOPY;  Surgeon: Jeanne Houston, MD;  [facility];  Service: Endoscopy;  Laterality: N/A;  8:30 am   COLONOSCOPY N/A 02/26/2017    Procedure: COLONOSCOPY;  Surgeon: Jeanne Houston, MD;  [facility];  Service: Endoscopy;  Laterality: N/A;  125   COLONOSCOPY WITH ESOPHAGOGASTRODUODENOSCOPY (EGD) AND ESOPHAGEAL DILATION (ED)  08-19-2005   and polypectomy   COMBINED MEDIASTINOSCOPY AND BRONCHOSCOPY  10-19-2004  dr Arlyce Dice   bx right middle lobe lesion (Epitheloid Granulomata) and left lower lobe (Hilar Adenopathy)   TONSILLECTOMY  age 57   TRANSTHORACIC ECHOCARDIOGRAM  09-11-2009   mild LVH/  ef 24-58%/ grade I diastolic dysfunction/  mild MR/  mild LAE/  trivial TR   VAGINAL HYSTERECTOMY  1987     Current Meds  Medication Sig   ALPRAZolam (XANAX) 0.5 MG tablet Take 0.5 mg by mouth at bedtime.   aspirin EC 81 MG tablet Take 81 mg by mouth daily. Swallow whole.   ezetimibe (ZETIA) 10 MG tablet Take 10 mg by mouth daily.   gabapentin (NEURONTIN) 300 MG capsule prn   loratadine (CLARITIN) 10 MG tablet Take by mouth.   naproxen sodium (ALEVE) 220 MG tablet Take 220 mg by mouth daily as needed.   Omega-3 Fatty Acids (FISH OIL) 1200 MG CAPS Take by mouth.   rosuvastatin (CRESTOR) 10 MG tablet Take 1 tablet (10 mg total) by mouth daily.   VITAMIN D, CHOLECALCIFEROL, PO Take 1 tablet by mouth daily.     Allergies:   Tetanus toxoids   Social History   Tobacco Use   Smoking status: Never   Smokeless tobacco: Never  Substance Use Topics  Alcohol use: No   Drug use: No     Family Hx: The patient's family history includes Aneurysm in her father; Breast cancer in her sister; Diabetes in her mother; Heart attack in her father and mother; Hypertension in her mother; Osteoarthritis in her father.  ROS:   Please see the history of present illness.    All other systems reviewed and are negative.   Prior CV studies:   The following studies were reviewed today:  Echo 2010  1. Left ventricle: The cavity size was normal. Wall thickness was     increased in a pattern of mild LVH. Systolic function was normal.      The estimated ejection fraction was in the range of 60% to 65%.     Doppler parameters are consistent with abnormal left ventricular     relaxation (grade 1 diastolic dysfunction).  2. Mitral valve: Mild regurgitation.  3. Left atrium: The atrium was mildly dilated.  4. Tricuspid valve: Trivial regurgitation.  5. Pericardium, extracardiac: There was no pericardial effusion.  Labs/Other Tests and Data Reviewed:    EKG:  EKG is personally reviewed. 01/21/2022: sinus bradycardia at 59 bpm 01/23/2021: Sinus bradycardia at 48 bpm.  Recent Labs: No results found for requested labs within last 8760 hours.   Recent Lipid Panel No results found for: CHOL, TRIG, HDL, CHOLHDL, LDLCALC, LDLDIRECT  Wt Readings from Last 3 Encounters:  01/21/22 137 lb 14.4 oz (62.6 kg)  01/23/21 149 lb 3.2 oz (67.7 kg)  02/02/20 152 lb (68.9 kg)     Objective:    Vital Signs:  BP 138/80 (BP Location: Right Arm, Patient Position: Sitting, Cuff Size: Normal)    Pulse (!) 59    Ht 5\' 2"  (1.575 m)    Wt 137 lb 14.4 oz (62.6 kg)    BMI 25.22 kg/m    GEN: Well nourished, well developed in no acute distress HEENT: Normal, moist mucous membranes NECK: No JVD CARDIAC: regular rhythm, normal S1 and S2, no rubs or gallops. 1/6 systolic murmur. VASCULAR: Radial and DP pulses 2+ bilaterally. No carotid bruits RESPIRATORY:  Clear to auscultation without rales, wheezing or rhonchi  ABDOMEN: Soft, non-tender, non-distended MUSCULOSKELETAL:  Ambulates independently SKIN: Warm and dry, no edema NEUROLOGIC:  Alert and oriented x 3. No focal neuro deficits noted. PSYCHIATRIC:  Normal affect   ASSESSMENT & PLAN:    1. Pure hypercholesterolemia   2. Elevated blood pressure reading   3. Murmur, cardiac   4. Diastolic dysfunction   5. Counseling on health promotion and disease prevention   6. Cardiac risk counseling   7. Medication management    Hypercholesterolemia: -labs from [facility] 04/24/21 reviewed: Tchol 294, HDL  98, LDL 183, TG 82 on ezetimibe -we discussed her high LDL today. Given that this was on ezetimibe, suspect initial LDL >190, suggestive of heterozygous familial hypercholesterolemia -we discussed data, recommendations for statins today. She is amenable. -I would aim for LDL close to 100 but <130 -will start rosuvastatin 10 mg daily in addition to ezetimibe, recheck labs in 3 mos. Can either then increase the rosuvastatin and/or stop the ezetimibe based on the numbers  History of sarcoid:  No recent flares  History of grade 1 diastolic dysfunction: no clinical symptoms of volume overload. Euvolemic on exam today  History of soft systolic murmur: very soft, asymptomatic  Elevated blood pressure reading:  -not yet at level that needs treatment, but continue to monitor  CV risk counseling and prevention: -recommend heart healthy/Mediterranean  diet, with whole grains, fruits, vegetable, fish, lean meats, nuts, and olive oil. Limit salt. -recommend moderate walking, 3-5 times/week for 30-50 minutes each session. Aim for at least 150 minutes.week. Goal should be pace of 3 miles/hours, or walking 1.5 miles in 30 minutes -recommend avoidance of tobacco products. Avoid excess alcohol. -she does not meet recommendations for aspirin for prevention, but she wishes to continue taking.  Follow up: 1 year or sooner as needed  Medication Adjustments/Labs and Tests Ordered: Current medicines are reviewed at length with the patient today.  Concerns regarding medicines are outlined above.   Tests Ordered: Orders Placed This Encounter  Procedures   Lipid panel   EKG 12-Lead   Medication Changes: Meds ordered this encounter  Medications   rosuvastatin (CRESTOR) 10 MG tablet    Sig: Take 1 tablet (10 mg total) by mouth daily.    Dispense:  90 tablet    Refill:  3   Patient Instructions  Medication Instructions:  1) START: Rosuvastatin (Crestor) 10 mg daily  *If you need a refill on your cardiac  medications before your next appointment, please call your pharmacy*   Lab Work: Your provider has recommended lab work in May, 2023 (fasting lipid). Please have this collected at [facility] at [place]. The lab is open 8:00 am - 4:30 pm. Please avoid 12:00p - 1:00p for lunch hour. You do not need an appointment. Please go to [address] [place], [place] [address]. This is in the Primary Care office on the 3rd floor, let them know you are there for blood work and they will direct you to the lab.  If you have labs (blood work) drawn today and your tests are completely normal, you will receive your results only by: [place] (if you have MyChart) OR A paper copy in the mail If you have any lab test that is abnormal or we need to change your treatment, we will call you to review the results.   Testing/Procedures: None ordered today   Follow-Up: At [facility], you and your health needs are our priority.  As part of our continuing mission to provide you with exceptional heart care, we have created designated Provider Care Teams.  These Care Teams include your primary Cardiologist (physician) and Advanced Practice Providers (APPs -  Physician Assistants and Nurse Practitioners) who all work together to provide you with the care you need, when you need it.  We recommend signing up for the patient portal called "MyChart".  Sign up information is provided on this After Visit Summary.  MyChart is used to connect with patients for Virtual Visits (Telemedicine).  Patients are able to view lab/test results, encounter notes, upcoming appointments, etc.  Non-urgent messages can be sent to your provider as well.   To learn more about what you can do with MyChart, go to [email or web address].    Your next appointment:   1 year(s)  The format for your next appointment:   In Person  Provider:   Buford Dresser, MD     [facility],acting as a scribe for  Buford Dresser, MD.,have documented all relevant documentation on the behalf of Buford Dresser, MD,as directed by  Buford Dresser, MD while in the presence of Buford Dresser, MD.  I, Buford Dresser, MD, have reviewed all documentation for this visit. The documentation on 01/21/22 for the exam, diagnosis, procedures, and orders are all accurate and complete.   Signed, Buford Dresser, MD  01/21/2022 9:24 AM  [facility]

## 2022-01-21 ENCOUNTER — Ambulatory Visit (HOSPITAL_BASED_OUTPATIENT_CLINIC_OR_DEPARTMENT_OTHER): Payer: Medicare Other | Admitting: Cardiology

## 2022-01-21 ENCOUNTER — Encounter (HOSPITAL_BASED_OUTPATIENT_CLINIC_OR_DEPARTMENT_OTHER): Payer: Self-pay | Admitting: Cardiology

## 2022-01-21 ENCOUNTER — Other Ambulatory Visit: Payer: Self-pay

## 2022-01-21 VITALS — BP 138/80 | HR 59 | Ht 62.0 in | Wt 137.9 lb

## 2022-01-21 DIAGNOSIS — R030 Elevated blood-pressure reading, without diagnosis of hypertension: Secondary | ICD-10-CM | POA: Diagnosis not present

## 2022-01-21 DIAGNOSIS — E7800 Pure hypercholesterolemia, unspecified: Secondary | ICD-10-CM

## 2022-01-21 DIAGNOSIS — Z7189 Other specified counseling: Secondary | ICD-10-CM

## 2022-01-21 DIAGNOSIS — R011 Cardiac murmur, unspecified: Secondary | ICD-10-CM | POA: Diagnosis not present

## 2022-01-21 DIAGNOSIS — I5189 Other ill-defined heart diseases: Secondary | ICD-10-CM

## 2022-01-21 DIAGNOSIS — Z79899 Other long term (current) drug therapy: Secondary | ICD-10-CM

## 2022-01-21 MED ORDER — ROSUVASTATIN CALCIUM 10 MG PO TABS: 10.0000 mg | ORAL_TABLET | Freq: Every day | ORAL | 3 refills | Status: DC

## 2022-01-21 NOTE — Patient Instructions (Signed)
Medication Instructions:  1) START: Rosuvastatin (Crestor) 10 mg daily  *If you need a refill on your cardiac medications before your next appointment, please call your pharmacy*   Lab Work: Your provider has recommended lab work in May, 2023 (fasting lipid). Please have this collected at [facility] at [place]. The lab is open 8:00 am - 4:30 pm. Please avoid 12:00p - 1:00p for lunch hour. You do not need an appointment. Please go to [address] [place], [place] [address]. This is in the Primary Care office on the 3rd floor, let them know you are there for blood work and they will direct you to the lab.  If you have labs (blood work) drawn today and your tests are completely normal, you will receive your results only by: [place] (if you have MyChart) OR A paper copy in the mail If you have any lab test that is abnormal or we need to change your treatment, we will call you to review the results.   Testing/Procedures: None ordered today   Follow-Up: At [facility], you and your health needs are our priority.  As part of our continuing mission to provide you with exceptional heart care, we have created designated Provider Care Teams.  These Care Teams include your primary Cardiologist (physician) and Advanced Practice Providers (APPs -  Physician Assistants and Nurse Practitioners) who all work together to provide you with the care you need, when you need it.  We recommend signing up for the patient portal called "MyChart".  Sign up information is provided on this After Visit Summary.  MyChart is used to connect with patients for Virtual Visits (Telemedicine).  Patients are able to view lab/test results, encounter notes, upcoming appointments, etc.  Non-urgent messages can be sent to your provider as well.   To learn more about what you can do with MyChart, go to [email or web address].    Your next appointment:   1 year(s)  The format for your next  appointment:   In Person  Provider:   Buford Dresser, MD

## 2022-01-30 ENCOUNTER — Encounter (INDEPENDENT_AMBULATORY_CARE_PROVIDER_SITE_OTHER): Payer: Self-pay | Admitting: *Deleted

## 2022-01-30 DIAGNOSIS — Z79899 Other long term (current) drug therapy: Secondary | ICD-10-CM | POA: Diagnosis not present

## 2022-01-30 LAB — LIPID PANEL
Chol/HDL Ratio: 1.9 ratio (ref 0.0–4.4)
Cholesterol, Total: 190 mg/dL (ref 100–199)
HDL: 99 mg/dL (ref >39)
LDL Chol Calc (NIH): 80 mg/dL (ref 0–99)
Triglycerides: 59 mg/dL (ref 0–149)
VLDL Cholesterol Cal: 11 mg/dL (ref 5–40)

## 2022-02-14 ENCOUNTER — Telehealth (INDEPENDENT_AMBULATORY_CARE_PROVIDER_SITE_OTHER): Payer: Self-pay | Admitting: *Deleted

## 2022-02-14 ENCOUNTER — Encounter (INDEPENDENT_AMBULATORY_CARE_PROVIDER_SITE_OTHER): Payer: Self-pay | Admitting: *Deleted

## 2022-02-14 NOTE — Telephone Encounter (Signed)
Patient needs trilyte 

## 2022-02-18 ENCOUNTER — Other Ambulatory Visit (INDEPENDENT_AMBULATORY_CARE_PROVIDER_SITE_OTHER): Payer: Self-pay

## 2022-02-18 DIAGNOSIS — M47896 Other spondylosis, lumbar region: Secondary | ICD-10-CM | POA: Diagnosis not present

## 2022-02-18 DIAGNOSIS — Z1211 Encounter for screening for malignant neoplasm of colon: Secondary | ICD-10-CM

## 2022-02-18 DIAGNOSIS — M816 Localized osteoporosis [Lequesne]: Secondary | ICD-10-CM | POA: Diagnosis not present

## 2022-02-19 MED ORDER — PEG 3350-KCL-NA BICARB-NACL 420 G PO SOLR: 4000.0000 mL | Freq: Once | ORAL | 0 refills | Status: AC

## 2022-02-21 ENCOUNTER — Telehealth (INDEPENDENT_AMBULATORY_CARE_PROVIDER_SITE_OTHER): Payer: Self-pay | Admitting: *Deleted

## 2022-02-21 NOTE — Telephone Encounter (Signed)
Referring MD/PCP: fusco ? ?Procedure: tcs ? ?Reason/Indication:  screening ? ?Has patient had this procedure before?  Yes, 10 yrs ago ? If so, when, by whom and where?   ? ?Is there a family history of colon cancer?  no ? Who?  What age when diagnosed?   ? ?Is patient diabetic? If yes, Type 1 or Type 2   no ?     ?Does patient have prosthetic heart valve or mechanical valve?  no ? ?Do you have a pacemaker/defibrillator?  no ? ?Has patient ever had endocarditis/atrial fibrillation? no ? ?Does patient use oxygen? no ? ?Has patient had joint replacement within last 12 months?  no ? ?Is patient constipated or do they take laxatives? no ? ?Does patient have a history of alcohol/drug use?  no ? ?Have you had a stroke/heart attack last 6 mths? no ? ?Do you take medicine for weight loss?  no ? ?For female patients,: have you had a hysterectomy yes ?                     are you post menopausal  ?                     do you still have your menstrual cycle no ? ?Is patient on blood thinner such as Coumadin, Plavix and/or Aspirin? yes ? ?Medications: asa 81 mg daily, allegra daily, rosuvastatin 10 mg daily, alrpazolam 0.5 mg daily ? ?Allergies: tetanus ? ?Medication Adjustment per Dr Rehman/Dr Jenetta Downer asa 2 days ? ?Procedure date & time: 03/21/22 ? ? ?

## 2022-03-11 DIAGNOSIS — Z1321 Encounter for screening for nutritional disorder: Secondary | ICD-10-CM | POA: Diagnosis not present

## 2022-03-12 DIAGNOSIS — M5416 Radiculopathy, lumbar region: Secondary | ICD-10-CM | POA: Diagnosis not present

## 2022-03-12 DIAGNOSIS — M25511 Pain in right shoulder: Secondary | ICD-10-CM | POA: Diagnosis not present

## 2022-03-13 NOTE — Patient Instructions (Signed)
? ? ? ? ? ? Nadezhda Pinnix Lucien ? 03/13/2022  ?  ? '@PREFPERIOPPHARMACY'$ @ ? ? Your procedure is scheduled on  03/21/2022. ? ? Report to [facility] at  [place].[facility]. ? ? Call this number if you have problems the morning of surgery: ? [phone] ? ? Remember: ? Follow the diet and prep instructions given to you by the office. ?  ? Take these medicines the morning of surgery with A SIP OF WATER  ? ?xanax(if needed), gabapentin, claritin. ? ?  ? Do not wear jewelry, make-up or nail polish. ? Do not wear lotions, powders, or perfumes, or deodorant. ? Do not shave 48 hours prior to surgery.  Men may shave face and neck. ? Do not bring valuables to the hospital. ? [facility] is not responsible for any belongings or valuables. ? ?Contacts, dentures or bridgework may not be worn into surgery.  Leave your suitcase in the car.  After surgery it may be brought to your room. ? ?For patients admitted to the hospital, discharge time will be determined by your treatment team. ? ?Patients discharged the day of surgery will not be allowed to drive home and must have someone with them for 24 hours.  ? ? ?Special instructions:   DO NOT smoke tobacco or vape for 24 hours before your procedure. ? ?Please read over the following fact sheets that you were given. ?Anesthesia Post-op Instructions and Care and Recovery After Surgery ?  ? ? ? Colonoscopy, Adult, Care After ?This sheet gives you information about how to care for yourself after your procedure. Your health care provider may also give you more specific instructions. If you have problems or questions, contact your health care provider. ?What can I expect after the procedure? ?After the procedure, it is common to have: ?A small amount of blood in your stool for 24 hours after the procedure. ?Some gas. ?Mild cramping or bloating of your abdomen. ?Follow these instructions at home: ?Eating and drinking ? ?Drink enough fluid to keep your urine pale yellow. ?Follow instructions from your  health care provider about eating or drinking restrictions. ?Resume your normal diet as instructed by your health care provider. Avoid heavy or fried foods that are hard to digest. ?Activity ?Rest as told by your health care provider. ?Avoid sitting for a long time without moving. Get up to take short walks every 1-2 hours. This is important to improve blood flow and breathing. Ask for help if you feel weak or unsteady. ?Return to your normal activities as told by your health care provider. Ask your health care provider what activities are safe for you. ?Managing cramping and bloating ? ?Try walking around when you have cramps or feel bloated. ?Apply heat to your abdomen as told by your health care provider. Use the heat source that your health care provider recommends, such as a moist heat pack or a heating pad. ?Place a towel between your skin and the heat source. ?Leave the heat on for 20-30 minutes. ?Remove the heat if your skin turns bright red. This is especially important if you are unable to feel pain, heat, or cold. You may have a greater risk of getting burned. ?General instructions ?If you were given a sedative during the procedure, it can affect you for several hours. Do not drive or operate machinery until your health care provider says that it is safe. ?For the first 24 hours after the procedure: ?Do not sign important documents. ?Do not drink  alcohol. ?Do your regular daily activities at a slower pace than normal. ?Eat soft foods that are easy to digest. ?Take over-the-counter and prescription medicines only as told by your health care provider. ?Keep all follow-up visits as told by your health care provider. This is important. ?Contact a health care provider if: ?You have blood in your stool 2-3 days after the procedure. ?Get help right away if you have: ?More than a small spotting of blood in your stool. ?Large blood clots in your stool. ?Swelling of your abdomen. ?Nausea or vomiting. ?A  fever. ?Increasing pain in your abdomen that is not relieved with medicine. ?Summary ?After the procedure, it is common to have a small amount of blood in your stool. You may also have mild cramping and bloating of your abdomen. ?If you were given a sedative during the procedure, it can affect you for several hours. Do not drive or operate machinery until your health care provider says that it is safe. ?Get help right away if you have a lot of blood in your stool, nausea or vomiting, a fever, or increased pain in your abdomen. ?This information is not intended to replace advice given to you by your health care provider. Make sure you discuss any questions you have with your health care provider. ?Document Revised: 10/01/2019 Document Reviewed: 06/21/2019 ?Elsevier Patient Education ? [place]. ?Monitored Anesthesia Care, Care After ?This sheet gives you information about how to care for yourself after your procedure. Your health care provider may also give you more specific instructions. If you have problems or questions, contact your health care provider. ?What can I expect after the procedure? ?After the procedure, it is common to have: ?Tiredness. ?Forgetfulness about what happened after the procedure. ?Impaired judgment for important decisions. ?Nausea or vomiting. ?Some difficulty with balance. ?Follow these instructions at home: ?For the time period you were told by your health care provider: ?  ?Rest as needed. ?Do not participate in activities where you could fall or become injured. ?Do not drive or use machinery. ?Do not drink alcohol. ?Do not take sleeping pills or medicines that cause drowsiness. ?Do not make important decisions or sign legal documents. ?Do not take care of children on your own. ?Eating and drinking ?Follow the diet that is recommended by your health care provider. ?Drink enough fluid to keep your urine pale yellow. ?If you vomit: ?Drink water, juice, or soup when you can drink  without vomiting. ?Make sure you have little or no nausea before eating solid foods. ?General instructions ?Have a responsible adult stay with you for the time you are told. It is important to have someone help care for you until you are awake and alert. ?Take over-the-counter and prescription medicines only as told by your health care provider. ?If you have sleep apnea, surgery and certain medicines can increase your risk for breathing problems. Follow instructions from your health care provider about wearing your sleep device: ?Anytime you are sleeping, including during daytime naps. ?While taking prescription pain medicines, sleeping medicines, or medicines that make you drowsy. ?Avoid smoking. ?Keep all follow-up visits as told by your health care provider. This is important. ?Contact a health care provider if: ?You keep feeling nauseous or you keep vomiting. ?You feel light-headed. ?You are still sleepy or having trouble with balance after 24 hours. ?You develop a rash. ?You have a fever. ?You have redness or swelling around the IV site. ?Get help right away if: ?You have trouble breathing. ?You have new-onset  confusion at home. ?Summary ?For several hours after your procedure, you may feel tired. You may also be forgetful and have poor judgment. ?Have a responsible adult stay with you for the time you are told. It is important to have someone help care for you until you are awake and alert. ?Rest as told. Do not drive or operate machinery. Do not drink alcohol or take sleeping pills. ?Get help right away if you have trouble breathing, or if you suddenly become confused. ?This information is not intended to replace advice given to you by your health care provider. Make sure you discuss any questions you have with your health care provider. ?Document Revised: 08/10/2020 Document Reviewed: 10/28/2019 ?Elsevier Patient Education ? [place]. ? ?

## 2022-03-19 ENCOUNTER — Encounter (HOSPITAL_COMMUNITY)
Admission: RE | Admit: 2022-03-19 | Discharge: 2022-03-19 | Disposition: A | Discharge status: Home / Self Care | Payer: Medicare Other | Source: Ambulatory Visit | Attending: Internal Medicine | Admitting: Internal Medicine

## 2022-03-19 ENCOUNTER — Encounter (HOSPITAL_COMMUNITY): Payer: Self-pay

## 2022-03-21 ENCOUNTER — Encounter (HOSPITAL_COMMUNITY): Payer: Self-pay | Admitting: Internal Medicine

## 2022-03-21 ENCOUNTER — Ambulatory Visit (HOSPITAL_BASED_OUTPATIENT_CLINIC_OR_DEPARTMENT_OTHER): Payer: Medicare Other | Admitting: Anesthesiology

## 2022-03-21 ENCOUNTER — Encounter (HOSPITAL_COMMUNITY)
Admission: RE | Disposition: A | Discharge status: Home / Self Care | Payer: Self-pay | Source: Home / Self Care | Attending: Internal Medicine

## 2022-03-21 ENCOUNTER — Ambulatory Visit (HOSPITAL_COMMUNITY): Payer: Medicare Other | Admitting: Anesthesiology

## 2022-03-21 ENCOUNTER — Other Ambulatory Visit: Payer: Self-pay

## 2022-03-21 ENCOUNTER — Encounter (INDEPENDENT_AMBULATORY_CARE_PROVIDER_SITE_OTHER): Payer: Self-pay | Admitting: *Deleted

## 2022-03-21 ENCOUNTER — Ambulatory Visit (HOSPITAL_COMMUNITY)
Admission: RE | Admit: 2022-03-21 | Discharge: 2022-03-21 | Disposition: A | Discharge status: Home / Self Care | Payer: Medicare Other | Attending: Internal Medicine | Admitting: Internal Medicine

## 2022-03-21 DIAGNOSIS — K635 Polyp of colon: Secondary | ICD-10-CM | POA: Diagnosis not present

## 2022-03-21 DIAGNOSIS — D123 Benign neoplasm of transverse colon: Secondary | ICD-10-CM | POA: Diagnosis not present

## 2022-03-21 DIAGNOSIS — Z800 Family history of malignant neoplasm of digestive organs: Secondary | ICD-10-CM | POA: Insufficient documentation

## 2022-03-21 DIAGNOSIS — D860 Sarcoidosis of lung: Secondary | ICD-10-CM | POA: Diagnosis not present

## 2022-03-21 DIAGNOSIS — Z1211 Encounter for screening for malignant neoplasm of colon: Secondary | ICD-10-CM | POA: Diagnosis not present

## 2022-03-21 DIAGNOSIS — Z09 Encounter for follow-up examination after completed treatment for conditions other than malignant neoplasm: Secondary | ICD-10-CM | POA: Diagnosis not present

## 2022-03-21 DIAGNOSIS — Z86010 Personal history of colonic polyps: Secondary | ICD-10-CM | POA: Diagnosis not present

## 2022-03-21 DIAGNOSIS — K5730 Diverticulosis of large intestine without perforation or abscess without bleeding: Secondary | ICD-10-CM | POA: Diagnosis not present

## 2022-03-21 DIAGNOSIS — Z139 Encounter for screening, unspecified: Secondary | ICD-10-CM | POA: Diagnosis not present

## 2022-03-21 HISTORY — PX: BIOPSY: SHX5522

## 2022-03-21 HISTORY — PX: COLONOSCOPY WITH PROPOFOL: SHX5780

## 2022-03-21 HISTORY — PX: POLYPECTOMY: SHX5525

## 2022-03-21 LAB — HM COLONOSCOPY

## 2022-03-21 SURGERY — COLONOSCOPY WITH PROPOFOL
Anesthesia: General

## 2022-03-21 MED ORDER — PROPOFOL 10 MG/ML IV BOLUS
INTRAVENOUS | Status: DC | PRN
  Administered 2022-03-21: 60 mg via INTRAVENOUS

## 2022-03-21 MED ORDER — PROPOFOL 500 MG/50ML IV EMUL
INTRAVENOUS | Status: DC | PRN
  Administered 2022-03-21: 200 ug/kg/min via INTRAVENOUS

## 2022-03-21 MED ORDER — LIDOCAINE HCL (CARDIAC) PF 50 MG/5ML IV SOSY
PREFILLED_SYRINGE | INTRAVENOUS | Status: DC | PRN
  Administered 2022-03-21: 50 mg via INTRAVENOUS

## 2022-03-21 MED ORDER — LACTATED RINGERS IV SOLN: INTRAVENOUS | Status: DC

## 2022-03-21 NOTE — Op Note (Signed)
[facility] ?Patient Name: Jeanne Chang ?Procedure Date: 03/21/2022 10:08 AM ?MRN: [id number] ?Date of Birth: [birth year] ?Attending MD: Hildred Laser , MD ?CSN: [id number] ?Age: 71 ?Admit Type: Outpatient ?Procedure:                Colonoscopy ?Indications:              High risk colon cancer surveillance: Personal  ?                          history of colonic polyps, Family history of rectal  ?                          cancer in a first-degree relative before age 45  ?                          years ?Providers:                Hildred Laser, MD, Rosina Lowenstein, RN, Crisann  ?                          Wynonia Lawman, Technician ?Referring MD:             Redmond School, MD ?Medicines:                Propofol per Anesthesia ?Complications:            No immediate complications. ?Estimated Blood Loss:     Estimated blood loss was minimal. ?Procedure:                Pre-Anesthesia Assessment: ?                          - Prior to the procedure, a History and Physical  ?                          was performed, and patient medications and  ?                          allergies were reviewed. The patient's tolerance of  ?                          previous anesthesia was also reviewed. The risks  ?                          and benefits of the procedure and the sedation  ?                          options and risks were discussed with the patient.  ?                          All questions were answered, and informed consent  ?                          was obtained. Prior Anticoagulants: The patient has  ?  taken no previous anticoagulant or antiplatelet  ?                          agents except for aspirin and has taken no previous  ?                          anticoagulant or antiplatelet agents except for  ?                          NSAID medication. ASA Grade Assessment: II - A  ?                          patient with mild systemic disease. After reviewing  ?                          the risks and  benefits, the patient was deemed in  ?                          satisfactory condition to undergo the procedure. ?                          After obtaining informed consent, the colonoscope  ?                          was passed under direct vision. Throughout the  ?                          procedure, the patient's blood pressure, pulse, and  ?                          oxygen saturations were monitored continuously. The  ?                          PCF-HQ190L ([id number]) scope was introduced through  ?                          the anus and advanced to the the cecum, identified  ?                          by appendiceal orifice and ileocecal valve. The  ?                          colonoscopy was performed without difficulty. The  ?                          patient tolerated the procedure well. The quality  ?                          of the bowel preparation was good. The ileocecal  ?                          valve, appendiceal orifice, and rectum were  ?  photographed. ?Scope In: 10:27:26 AM ?Scope Out: 10:49:20 AM ?Scope Withdrawal Time: 0 hours 14 minutes 18 seconds  ?Total Procedure Duration: 0 hours 21 minutes 54 seconds  ?Findings: ?     The perianal and digital rectal examinations were normal. ?     A small polyp was found in the transverse colon. Biopsies were taken  ?     with a cold forceps for histology. The pathology specimen was placed  ?     into Bottle Number 1. ?     A 4 mm polyp was found in the transverse colon. The polyp was removed  ?     with a cold snare. Resection and retrieval were complete. The pathology  ?     specimen was placed into Bottle Number 1. ?     Scattered diverticula were found in the sigmoid colon and transverse  ?     colon. ?     The retroflexed view of the distal rectum and anal verge was normal and  ?     showed no anal or rectal abnormalities. ?Impression:               - One small polyp in the transverse colon. Biopsied. ?                          -  One 4 mm polyp in the transverse colon, removed  ?                          with a cold snare. Resected and retrieved. ?                          - Diverticulosis in the sigmoid colon and in the  ?                          transverse colon. ?Moderate Sedation: ?     Per Anesthesia Care ?Recommendation:           - Patient has a contact number available for  ?                          emergencies. The signs and symptoms of potential  ?                          delayed complications were discussed with the  ?                          patient. Return to normal activities tomorrow.  ?                          Written discharge instructions were provided to the  ?                          patient. ?                          - High fiber diet today. ?                          - Continue present medications. ?                          -  No aspirin, ibuprofen, naproxen, or other  ?                          non-steroidal anti-inflammatory drugs for 1 day. ?                          - Await pathology results. ?                          - Repeat colonoscopy in 5 years for surveillance. ?Procedure Code(s):        --- Professional --- ?                          [phone], Colonoscopy, flexible; with removal of  ?                          tumor(s), polyp(s), or other lesion(s) by snare  ?                          technique ?                          [id number], 59, Colonoscopy, flexible; with biopsy,  ?                          single or multiple ?Diagnosis Code(s):        --- Professional --- ?                          Z86.010, Personal history of colonic polyps ?                          K63.5, Polyp of colon ?                          Z80.0, Family history of malignant neoplasm of  ?                          digestive organs ?                          K57.30, Diverticulosis of large intestine without  ?                          perforation or abscess without bleeding ?CPT copyright 2019 [employer]. All rights  reserved. ?The codes documented in this report are preliminary and upon coder review may  ?be revised to meet current compliance requirements. ?Hildred Laser, MD ?Hildred Laser, MD ?03/21/2022 10:59:42 AM ?This report has been signed electronically. ?Number of Addenda: 0 ?

## 2022-03-21 NOTE — Transfer of Care (Signed)
Immediate Anesthesia Transfer of Care Note ? ?Patient: Jeanne Chang ? ?Procedure(s) Performed: COLONOSCOPY WITH PROPOFOL ?BIOPSY ?POLYPECTOMY ? ?Patient Location: Short Stay ? ?Anesthesia Type:MAC ? ?Level of Consciousness: sedated, patient cooperative and responds to stimulation ? ?Airway & Oxygen Therapy: Patient Spontanous Breathing ? ?Post-op Assessment: Report given to RN, Post -op Vital signs reviewed and stable and Patient moving all extremities ? ?Post vital signs: Reviewed and stable ? ?Last Vitals:  ?Vitals Value Taken Time  ?BP 91/38 03/21/22 1055  ?Temp 36.5 ?C 03/21/22 1055  ?Pulse    ?Resp 17 03/21/22 1055  ?SpO2 100 % 03/21/22 1055  ? ? ?Last Pain:  ?Vitals:  ? 03/21/22 1055  ?TempSrc: Oral  ?PainSc: 0-No pain  ?   ? ?  ? ?Complications: No notable events documented. ?

## 2022-03-21 NOTE — H&P (Signed)
Jeanne Chang is an 71 y.o. female.   ?Chief Complaint: Patient is here for colonoscopy. ?HPI: Patient is 71 year old African-American female who is here for surveillance colonoscopy.  She has a history of colonic adenomas.  Last exam was in March 2018 and no polyps were found.  She had tubular adenoma removed in 2012 and 2 adenomas removed on colonoscopy 5 years earlier.  She denies abdominal pain or rectal bleeding.  She says her bowels have always been irregular. ?Family history is significant for colon cancer in her mother who was in her 107s and died at 70 of unrelated causes. ?Patient is on low-dose aspirin which is on hold.  Last dose was 2 days ago. ? ?Past Medical History:  ?Diagnosis Date  ? Arthritis   ? GERD (gastroesophageal reflux disease)   ? History of adenomatous polyp of colon   ? History of gastroesophageal reflux (GERD)   ? History of hiatal hernia   ? Nocturia   ? Pelvic prolapse   ? Sarcoidosis of lung ([place])   ? dx 11/ 2005  ? Vaginal vault prolapse   ? ? ?Past Surgical History:  ?Procedure Laterality Date  ? ANTERIOR AND POSTERIOR REPAIR N/A 01/16/2015  ? Procedure: ANTERIOR VAULT REPIAR WITH KELLY PLICATION AND APICAL SACROSPINUS SUSPENSION WITH COLOPLAST AUGMENTATION/ CYSTOSCOPY COLD CUP BLADDER BIOPSY AND CAUTERIZATION AT THE BLADDER DOME;  Surgeon: Ailene Rud, MD;  Location: [facility];  Service: Urology;  Laterality: N/A;  ? CHOLECYSTECTOMY  age 69  ? COLONOSCOPY  11/13/2011  ? Procedure: COLONOSCOPY;  Surgeon: Rogene Houston, MD;  [facility];  Service: Endoscopy;  Laterality: N/A;  8:30 am  ? COLONOSCOPY N/A 02/26/2017  ? Procedure: COLONOSCOPY;  Surgeon: Rogene Houston, MD;  Location: [facility];  Service: Endoscopy;  Laterality: N/A;  89  ? COLONOSCOPY WITH ESOPHAGOGASTRODUODENOSCOPY (EGD) AND ESOPHAGEAL DILATION (ED)  08-19-2005  ? and polypectomy  ? COMBINED MEDIASTINOSCOPY AND BRONCHOSCOPY  10-19-2004  dr Arlyce Dice  ? bx right middle  lobe lesion (Epitheloid Granulomata) and left lower lobe (Hilar Adenopathy)  ? TONSILLECTOMY  age 60  ? TRANSTHORACIC ECHOCARDIOGRAM  09-11-2009  ? mild LVH/  ef 94-70%/ grade I diastolic dysfunction/  mild MR/  mild LAE/  trivial TR  ? VAGINAL HYSTERECTOMY  1987  ? ? ?Family History  ?Problem Relation Age of Onset  ? Diabetes Mother   ? Heart attack Mother   ? Hypertension Mother   ? Heart attack Father   ? Osteoarthritis Father   ? Aneurysm Father   ? Breast cancer Sister   ? ?Social History:  reports that she has never smoked. She has never used smokeless tobacco. She reports that she does not drink alcohol and does not use drugs. ? ?Allergies:  ?Allergies  ?Allergen Reactions  ? Tetanus Toxoids Swelling  ? ? ?Medications Prior to Admission  ?Medication Sig Dispense Refill  ? ALPRAZolam (XANAX) 0.5 MG tablet Take 0.5 mg by mouth at bedtime as needed for anxiety or sleep.    ? aspirin EC 81 MG tablet Take 81 mg by mouth daily. Swallow whole.    ? CALCIUM PO Take 1 tablet by mouth daily.    ? gabapentin (NEURONTIN) 300 MG capsule Take 300 mg by mouth daily as needed (pain).    ? loratadine (CLARITIN) 10 MG tablet Take 10 mg by mouth daily.    ? naproxen sodium (ALEVE) 220 MG tablet Take 220 mg by mouth daily.    ?  Omega-3 Fatty Acids (FISH OIL) 1200 MG CAPS Take 1,200 mg by mouth daily.    ? rosuvastatin (CRESTOR) 10 MG tablet Take 1 tablet (10 mg total) by mouth daily. 90 tablet 3  ? VITAMIN D, CHOLECALCIFEROL, PO Take 1 tablet by mouth daily.    ? ? ?No results found for this or any previous visit (from the past 48 hour(s)). ?No results found. ? ?Review of Systems ? ?Blood pressure (!) 128/58, pulse (!) 52, temperature 98.2 ?F (36.8 ?C), temperature source Oral, resp. rate 13, SpO2 99 %. ?Physical Exam ?HENT:  ?   Mouth/Throat:  ?   Mouth: Mucous membranes are moist.  ?   Pharynx: Oropharynx is clear.  ?Eyes:  ?   General: No scleral icterus. ?   Conjunctiva/sclera: Conjunctivae normal.  ?Cardiovascular:  ?    Rate and Rhythm: Normal rate and regular rhythm.  ?   Heart sounds: Normal heart sounds. No murmur heard. ?Pulmonary:  ?   Effort: Pulmonary effort is normal.  ?   Breath sounds: Normal breath sounds.  ?Abdominal:  ?   General: There is no distension.  ?   Palpations: Abdomen is soft. There is no mass.  ?   Tenderness: There is no abdominal tenderness.  ?Musculoskeletal:     ?   General: No swelling.  ?   Cervical back: Neck supple.  ?Lymphadenopathy:  ?   Cervical: No cervical adenopathy.  ?Skin: ?   General: Skin is warm and dry.  ?Neurological:  ?   Mental Status: She is alert.  ?  ? ?Assessment/Plan ? ?History of colonic adenomas. ?Family history of colon cancer in first-degree relative younger than 74. ?Surveillance colonoscopy. ? ?Hildred Laser, MD ?03/21/2022, 10:20 AM ? ? ? ?

## 2022-03-21 NOTE — Anesthesia Postprocedure Evaluation (Signed)
Anesthesia Post Note ? ?Patient: Jeanne Chang ? ?Procedure(s) Performed: COLONOSCOPY WITH PROPOFOL ?BIOPSY ?POLYPECTOMY ? ?Patient location during evaluation: Phase II ?Anesthesia Type: General ?Level of consciousness: awake and alert and oriented ?Pain management: pain level controlled ?Vital Signs Assessment: post-procedure vital signs reviewed and stable ?Respiratory status: spontaneous breathing, nonlabored ventilation and respiratory function stable ?Cardiovascular status: blood pressure returned to baseline and stable ?Postop Assessment: no apparent nausea or vomiting ?Anesthetic complications: no ? ? ?No notable events documented. ? ? ?Last Vitals:  ?Vitals:  ? 03/21/22 0926 03/21/22 1055  ?BP: (!) 128/58 (!) 91/38  ?Pulse: (!) 52   ?Resp: 13 17  ?Temp: 36.8 ?C 36.5 ?C  ?SpO2: 99% 100%  ?  ?Last Pain:  ?Vitals:  ? 03/21/22 1055  ?TempSrc: Oral  ?PainSc: 0-No pain  ? ? ?  ?  ?  ?  ?  ?  ? ?[name] [name] ? ? ? ? ?

## 2022-03-21 NOTE — Anesthesia Preprocedure Evaluation (Signed)
Anesthesia Evaluation  ?Patient identified by MRN, date of birth, ID band ?Patient awake ? ? ? ?Reviewed: ?Allergy & Precautions, NPO status , Patient's Chart, lab work & pertinent test results ? ?Airway ?Mallampati: II ? ?TM Distance: >3 FB ?Neck ROM: Full ? ? ? Dental ? ?(+) Dental Advisory Given,  ?  ?Pulmonary ? ?Sarcoidosis  ?  ?Pulmonary exam normal ?breath sounds clear to auscultation ? ? ? ? ? ? Cardiovascular ?+ Valvular Problems/Murmurs  ?Rhythm:Regular Rate:Normal ?+ Systolic murmurs ? ?  ?Neuro/Psych ?negative neurological ROS ? negative psych ROS  ? GI/Hepatic ?Neg liver ROS, hiatal hernia, GERD  ,  ?Endo/Other  ?negative endocrine ROS ? Renal/GU ?negative Renal ROS  ?negative genitourinary ?  ?Musculoskeletal ? ?(+) Arthritis , Osteoarthritis,   ? Abdominal ?  ?Peds ?negative pediatric ROS ?(+)  Hematology ?negative hematology ROS ?(+)   ?Anesthesia Other Findings ? ? Reproductive/Obstetrics ?negative OB ROS ? ?  ? ? ? ? ? ? ? ? ? ? ? ? ? ?  ?  ? ? ? ? ? ? ? ?Anesthesia Physical ?Anesthesia Plan ? ?ASA: 2 ? ?Anesthesia Plan: General  ? ?Post-op Pain Management: Minimal or no pain anticipated  ? ?Induction: Intravenous ? ?PONV Risk Score and Plan: TIVA and Propofol infusion ? ?Airway Management Planned: Nasal Cannula and Natural Airway ? ?Additional Equipment:  ? ?Intra-op Plan:  ? ?Post-operative Plan:  ? ?Informed Consent: I have reviewed the patients History and Physical, chart, labs and discussed the procedure including the risks, benefits and alternatives for the proposed anesthesia with the patient or authorized representative who has indicated his/her understanding and acceptance.  ? ? ? ?Dental advisory given ? ?Plan Discussed with: CRNA and Surgeon ? ?Anesthesia Plan Comments:   ? ? ? ? ? ?Anesthesia Quick Evaluation ? ?

## 2022-03-21 NOTE — Discharge Instructions (Addendum)
Resume aspirin on 03/22/2022 ?Resume other medications as before ?High-fiber diet ?No driving for 24 hours ?Physician will call with biopsy results. ?Next colonoscopy in 5 years. ?

## 2022-03-22 LAB — SURGICAL PATHOLOGY

## 2022-03-25 ENCOUNTER — Encounter (HOSPITAL_COMMUNITY): Payer: Self-pay | Admitting: Internal Medicine

## 2022-03-25 DIAGNOSIS — M5416 Radiculopathy, lumbar region: Secondary | ICD-10-CM | POA: Diagnosis not present

## 2022-04-08 ENCOUNTER — Other Ambulatory Visit (HOSPITAL_BASED_OUTPATIENT_CLINIC_OR_DEPARTMENT_OTHER): Payer: Self-pay

## 2022-04-08 DIAGNOSIS — Z79899 Other long term (current) drug therapy: Secondary | ICD-10-CM

## 2022-04-18 DIAGNOSIS — Z79899 Other long term (current) drug therapy: Secondary | ICD-10-CM | POA: Diagnosis not present

## 2022-04-19 LAB — LIPID PANEL
Chol/HDL Ratio: 2.2 ratio (ref 0.0–4.4)
Cholesterol, Total: 229 mg/dL — ABNORMAL HIGH (ref 100–199)
HDL: 102 mg/dL (ref >39)
LDL Chol Calc (NIH): 116 mg/dL — ABNORMAL HIGH (ref 0–99)
Triglycerides: 66 mg/dL (ref 0–149)
VLDL Cholesterol Cal: 11 mg/dL (ref 5–40)

## 2022-04-29 DIAGNOSIS — M79671 Pain in right foot: Secondary | ICD-10-CM | POA: Diagnosis not present

## 2022-05-07 DIAGNOSIS — S92354A Nondisplaced fracture of fifth metatarsal bone, right foot, initial encounter for closed fracture: Secondary | ICD-10-CM | POA: Diagnosis not present

## 2022-06-13 DIAGNOSIS — M25511 Pain in right shoulder: Secondary | ICD-10-CM | POA: Diagnosis not present

## 2022-06-13 DIAGNOSIS — M5416 Radiculopathy, lumbar region: Secondary | ICD-10-CM | POA: Diagnosis not present

## 2022-06-14 DIAGNOSIS — H524 Presbyopia: Secondary | ICD-10-CM | POA: Diagnosis not present

## 2022-06-14 DIAGNOSIS — H2513 Age-related nuclear cataract, bilateral: Secondary | ICD-10-CM | POA: Diagnosis not present

## 2022-06-14 DIAGNOSIS — H25013 Cortical age-related cataract, bilateral: Secondary | ICD-10-CM | POA: Diagnosis not present

## 2022-06-14 DIAGNOSIS — H5203 Hypermetropia, bilateral: Secondary | ICD-10-CM | POA: Diagnosis not present

## 2022-06-20 DIAGNOSIS — S92354A Nondisplaced fracture of fifth metatarsal bone, right foot, initial encounter for closed fracture: Secondary | ICD-10-CM | POA: Diagnosis not present

## 2022-08-06 DIAGNOSIS — Z6824 Body mass index (BMI) 24.0-24.9, adult: Secondary | ICD-10-CM | POA: Diagnosis not present

## 2022-08-22 DIAGNOSIS — S92354A Nondisplaced fracture of fifth metatarsal bone, right foot, initial encounter for closed fracture: Secondary | ICD-10-CM | POA: Diagnosis not present

## 2022-09-02 DIAGNOSIS — M5416 Radiculopathy, lumbar region: Secondary | ICD-10-CM | POA: Diagnosis not present

## 2022-09-16 ENCOUNTER — Other Ambulatory Visit: Payer: Self-pay | Admitting: Obstetrics and Gynecology

## 2022-09-16 DIAGNOSIS — Z1231 Encounter for screening mammogram for malignant neoplasm of breast: Secondary | ICD-10-CM

## 2022-09-18 DIAGNOSIS — M5416 Radiculopathy, lumbar region: Secondary | ICD-10-CM | POA: Diagnosis not present

## 2022-09-18 DIAGNOSIS — M48061 Spinal stenosis, lumbar region without neurogenic claudication: Secondary | ICD-10-CM | POA: Diagnosis not present

## 2022-09-18 DIAGNOSIS — M25511 Pain in right shoulder: Secondary | ICD-10-CM | POA: Diagnosis not present

## 2022-10-08 DIAGNOSIS — Z23 Encounter for immunization: Secondary | ICD-10-CM | POA: Diagnosis not present

## 2022-10-16 ENCOUNTER — Ambulatory Visit
Admission: RE | Admit: 2022-10-16 | Discharge: 2022-10-16 | Disposition: A | Discharge status: Home / Self Care | Payer: Medicare Other | Source: Ambulatory Visit | Attending: Obstetrics and Gynecology | Admitting: Obstetrics and Gynecology

## 2022-10-16 DIAGNOSIS — Z1231 Encounter for screening mammogram for malignant neoplasm of breast: Secondary | ICD-10-CM | POA: Diagnosis not present

## 2022-10-24 DIAGNOSIS — S92354A Nondisplaced fracture of fifth metatarsal bone, right foot, initial encounter for closed fracture: Secondary | ICD-10-CM | POA: Diagnosis not present

## 2022-10-28 DIAGNOSIS — L0212 Furuncle of neck: Secondary | ICD-10-CM | POA: Diagnosis not present

## 2022-10-28 DIAGNOSIS — L910 Hypertrophic scar: Secondary | ICD-10-CM | POA: Diagnosis not present

## 2022-12-05 DIAGNOSIS — M542 Cervicalgia: Secondary | ICD-10-CM | POA: Diagnosis not present

## 2022-12-16 DIAGNOSIS — M5416 Radiculopathy, lumbar region: Secondary | ICD-10-CM | POA: Diagnosis not present

## 2022-12-23 DIAGNOSIS — B353 Tinea pedis: Secondary | ICD-10-CM | POA: Diagnosis not present

## 2022-12-23 DIAGNOSIS — B351 Tinea unguium: Secondary | ICD-10-CM | POA: Diagnosis not present

## 2022-12-27 DIAGNOSIS — M62838 Other muscle spasm: Secondary | ICD-10-CM | POA: Diagnosis not present

## 2022-12-30 DIAGNOSIS — L0212 Furuncle of neck: Secondary | ICD-10-CM | POA: Diagnosis not present

## 2023-01-06 DIAGNOSIS — B351 Tinea unguium: Secondary | ICD-10-CM | POA: Diagnosis not present

## 2023-02-03 DIAGNOSIS — B353 Tinea pedis: Secondary | ICD-10-CM | POA: Diagnosis not present

## 2023-02-18 ENCOUNTER — Other Ambulatory Visit (HOSPITAL_BASED_OUTPATIENT_CLINIC_OR_DEPARTMENT_OTHER): Payer: Self-pay | Admitting: Cardiology

## 2023-02-18 ENCOUNTER — Other Ambulatory Visit: Payer: Self-pay

## 2023-02-18 DIAGNOSIS — E7800 Pure hypercholesterolemia, unspecified: Secondary | ICD-10-CM

## 2023-02-18 MED ORDER — ROSUVASTATIN CALCIUM 10 MG PO TABS: 10.0000 mg | ORAL_TABLET | Freq: Every day | ORAL | 0 refills | Status: AC

## 2023-02-18 NOTE — Telephone Encounter (Signed)
Left message for patient to call and schedule overdue appointment with Dr. Harrell Gave for medication refills

## 2023-02-18 NOTE — Telephone Encounter (Signed)
Please call pt to schedule overdue 1 year follow-up appointment with Dr. Harrell Gave or APP for refills.

## 2023-02-21 NOTE — Telephone Encounter (Signed)
Patient is scheduled for 04/17/23 with Dr. Harrell Gave

## 2023-02-25 DIAGNOSIS — M48061 Spinal stenosis, lumbar region without neurogenic claudication: Secondary | ICD-10-CM | POA: Diagnosis not present

## 2023-02-25 DIAGNOSIS — M25511 Pain in right shoulder: Secondary | ICD-10-CM | POA: Diagnosis not present

## 2023-03-05 DIAGNOSIS — J069 Acute upper respiratory infection, unspecified: Secondary | ICD-10-CM | POA: Diagnosis not present

## 2023-03-17 DIAGNOSIS — Z0001 Encounter for general adult medical examination with abnormal findings: Secondary | ICD-10-CM | POA: Diagnosis not present

## 2023-03-17 DIAGNOSIS — G9332 Myalgic encephalomyelitis/chronic fatigue syndrome: Secondary | ICD-10-CM | POA: Diagnosis not present

## 2023-03-17 DIAGNOSIS — D518 Other vitamin B12 deficiency anemias: Secondary | ICD-10-CM | POA: Diagnosis not present

## 2023-03-17 DIAGNOSIS — M47812 Spondylosis without myelopathy or radiculopathy, cervical region: Secondary | ICD-10-CM | POA: Diagnosis not present

## 2023-03-17 DIAGNOSIS — E559 Vitamin D deficiency, unspecified: Secondary | ICD-10-CM | POA: Diagnosis not present

## 2023-03-17 DIAGNOSIS — I10 Essential (primary) hypertension: Secondary | ICD-10-CM | POA: Diagnosis not present

## 2023-03-17 DIAGNOSIS — E782 Mixed hyperlipidemia: Secondary | ICD-10-CM | POA: Diagnosis not present

## 2023-03-19 DIAGNOSIS — M5416 Radiculopathy, lumbar region: Secondary | ICD-10-CM | POA: Diagnosis not present

## 2023-03-21 ENCOUNTER — Encounter (HOSPITAL_BASED_OUTPATIENT_CLINIC_OR_DEPARTMENT_OTHER): Payer: Self-pay | Admitting: Cardiology

## 2023-03-21 ENCOUNTER — Ambulatory Visit (HOSPITAL_BASED_OUTPATIENT_CLINIC_OR_DEPARTMENT_OTHER): Payer: Medicare Other | Admitting: Cardiology

## 2023-03-21 VITALS — BP 130/68 | HR 67 | Ht 61.0 in | Wt 133.5 lb

## 2023-03-21 DIAGNOSIS — Z7189 Other specified counseling: Secondary | ICD-10-CM

## 2023-03-21 DIAGNOSIS — E7800 Pure hypercholesterolemia, unspecified: Secondary | ICD-10-CM | POA: Diagnosis not present

## 2023-03-21 DIAGNOSIS — I5189 Other ill-defined heart diseases: Secondary | ICD-10-CM

## 2023-03-21 DIAGNOSIS — R011 Cardiac murmur, unspecified: Secondary | ICD-10-CM

## 2023-03-21 NOTE — Patient Instructions (Signed)
Medication Instructions:  Your physician recommends that you continue on your current medications as directed. Please refer to the Current Medication list given to you today.  *If you need a refill on your cardiac medications before your next appointment, please call your pharmacy*  Lab Work: NONE  Testing/Procedures: NONE  Follow-Up: At [facility], you and your health needs are our priority.  As part of our continuing mission to provide you with exceptional heart care, we have created designated Provider Care Teams.  These Care Teams include your primary Cardiologist (physician) and Advanced Practice Providers (APPs -  Physician Assistants and Nurse Practitioners) who all work together to provide you with the care you need, when you need it.  We recommend signing up for the patient portal called "MyChart".  Sign up information is provided on this After Visit Summary.  MyChart is used to connect with patients for Virtual Visits (Telemedicine).  Patients are able to view lab/test results, encounter notes, upcoming appointments, etc.  Non-urgent messages can be sent to your provider as well.   To learn more about what you can do with MyChart, go to [email or web address].    Your next appointment:   12 month(s)  The format for your next appointment:   In Person  Provider:   Bridgette Christopher, MD     

## 2023-03-21 NOTE — Progress Notes (Signed)
Cardiology Office Note:    Date:  03/21/2023   ID:  JAYLIANNA TATLOCK, DOB 14-Apr-1951, MRN [id number]  PCP:  Elfredia Nevins, MD  Cardiologist:  Jodelle Red, MD  Referring MD: Elfredia Nevins, MD   Chief Complaint:  Follow up  History of Present Illness:    Jeanne Chang is a 72 y.o. female with PMHx sarcoid, grade 1 diastolic dysfunction, soft systolic murmur.  Today: Doing well overall. No new concerns today. Has to take the stairs a few times/day, able to mop her floors.   Tolerating meds well. No side effects she is aware of.  Denies chest pain, shortness of breath at rest or with normal exertion. No PND, orthopnea, LE edema or unexpected weight gain. No syncope or palpitations.   Past Medical History:  Diagnosis Date   Arthritis    GERD (gastroesophageal reflux disease)    History of adenomatous polyp of colon    History of gastroesophageal reflux (GERD)    History of hiatal hernia    Nocturia    Pelvic prolapse    Sarcoidosis of lung    dx 11/ 2005   Vaginal vault prolapse    Past Surgical History:  Procedure Laterality Date   ANTERIOR AND POSTERIOR REPAIR N/A 01/16/2015   Procedure: ANTERIOR VAULT REPIAR WITH KELLY PLICATION AND APICAL SACROSPINUS SUSPENSION WITH COLOPLAST AUGMENTATION/ CYSTOSCOPY COLD CUP BLADDER BIOPSY AND CAUTERIZATION AT THE BLADDER DOME;  Surgeon: Kathi Ludwig, MD;  Location: [facility];  Service: Urology;  Laterality: N/A;   BIOPSY  03/21/2022   Procedure: BIOPSY;  Surgeon: Malissa Hippo, MD;  [facility];  Service: Endoscopy;;   CHOLECYSTECTOMY  age 35   COLONOSCOPY  11/13/2011   Procedure: COLONOSCOPY;  Surgeon: Malissa Hippo, MD;  [facility];  Service: Endoscopy;  Laterality: N/A;  8:30 am   COLONOSCOPY N/A 02/26/2017   Procedure: COLONOSCOPY;  Surgeon: Malissa Hippo, MD;  [facility];  Service: Endoscopy;  Laterality: N/A;  125   COLONOSCOPY WITH  ESOPHAGOGASTRODUODENOSCOPY (EGD) AND ESOPHAGEAL DILATION (ED)  08-19-2005   and polypectomy   COLONOSCOPY WITH PROPOFOL N/A 03/21/2022   Procedure: COLONOSCOPY WITH PROPOFOL;  Surgeon: Malissa Hippo, MD;  [facility];  Service: Endoscopy;  Laterality: N/A;  1050   COMBINED MEDIASTINOSCOPY AND BRONCHOSCOPY  10-19-2004  dr Edwyna Shell   bx right middle lobe lesion (Epitheloid Granulomata) and left lower lobe (Hilar Adenopathy)   POLYPECTOMY  03/21/2022   Procedure: POLYPECTOMY;  Surgeon: Malissa Hippo, MD;  [facility];  Service: Endoscopy;;   TONSILLECTOMY  age 49   TRANSTHORACIC ECHOCARDIOGRAM  09-11-2009   mild LVH/  ef 60-65%/ grade I diastolic dysfunction/  mild MR/  mild LAE/  trivial TR   VAGINAL HYSTERECTOMY  1987     Current Meds  Medication Sig   ALPRAZolam (XANAX) 0.5 MG tablet Take 0.5 mg by mouth at bedtime as needed for anxiety or sleep.   AMITIZA 24 MCG capsule Take 24 mcg by mouth as needed for constipation.   aspirin EC 81 MG tablet Take 81 mg by mouth daily. Swallow whole.   CALCIUM PO Take 1 tablet by mouth daily.   gabapentin (NEURONTIN) 300 MG capsule Take 300 mg by mouth daily as needed (pain).   loratadine (CLARITIN) 10 MG tablet Take 10 mg by mouth daily.   Omega-3 Fatty Acids (FISH OIL) 1200 MG CAPS Take 1,200 mg by mouth daily.  rosuvastatin (CRESTOR) 10 MG tablet Take 1 tablet (10 mg total) by mouth daily.   VITAMIN D, CHOLECALCIFEROL, PO Take 1 tablet by mouth daily.     Allergies:   Tetanus toxoids   Social History   Tobacco Use   Smoking status: Never   Smokeless tobacco: Never  Substance Use Topics   Alcohol use: No   Drug use: No     Family Hx: The patient's family history includes Aneurysm in her father; Breast cancer in her sister; Diabetes in her mother; Heart attack in her father and mother; Hypertension in her mother; Osteoarthritis in her father.  ROS:   Please see the history of present illness.    All other  systems reviewed and are negative.   Prior CV studies:   The following studies were reviewed today:  Echo 2010  1. Left ventricle: The cavity size was normal. Wall thickness was     increased in a pattern of mild LVH. Systolic function was normal.     The estimated ejection fraction was in the range of 60% to 65%.     Doppler parameters are consistent with abnormal left ventricular     relaxation (grade 1 diastolic dysfunction).  2. Mitral valve: Mild regurgitation.  3. Left atrium: The atrium was mildly dilated.  4. Tricuspid valve: Trivial regurgitation.  5. Pericardium, extracardiac: There was no pericardial effusion.  Labs/Other Tests and Data Reviewed:    EKG:  EKG is personally reviewed. 03/21/23: NSR at 67 bpm 01/21/2022: sinus bradycardia at 59 bpm 01/23/2021: Sinus bradycardia at 48 bpm.  Recent Labs: No results found for requested labs within last 365 days.   Recent Lipid Panel Lab Results  Component Value Date/Time   CHOL 229 (H) 04/18/2022 08:46 AM   TRIG 66 04/18/2022 08:46 AM   HDL 102 04/18/2022 08:46 AM   CHOLHDL 2.2 04/18/2022 08:46 AM   LDLCALC 116 (H) 04/18/2022 08:46 AM    Wt Readings from Last 3 Encounters:  03/21/23 133 lb 8 oz (60.6 kg)  03/19/22 140 lb (63.5 kg)  01/21/22 137 lb 14.4 oz (62.6 kg)     Objective:    Vital Signs:  BP 130/68 (BP Location: Left Arm, Patient Position: Sitting, Cuff Size: Normal)   Pulse 67   Ht 5\' 1"  (1.549 m)   Wt 133 lb 8 oz (60.6 kg)   BMI 25.22 kg/m    GEN: Well nourished, well developed in no acute distress HEENT: Normal, moist mucous membranes NECK: No JVD CARDIAC: regular rhythm, normal S1 and S2, no rubs or gallops. 1/6 systolic murmur. VASCULAR: Radial and DP pulses 2+ bilaterally. No carotid bruits RESPIRATORY:  Clear to auscultation without rales, wheezing or rhonchi  ABDOMEN: Soft, non-tender, non-distended MUSCULOSKELETAL:  Ambulates independently SKIN: Warm and dry, no edema NEUROLOGIC:  Alert  and oriented x 3. No focal neuro deficits noted. PSYCHIATRIC:  Normal affect   ASSESSMENT & PLAN:    1. Murmur, cardiac   2. Diastolic dysfunction   3. Pure hypercholesterolemia   4. Counseling on health promotion and disease prevention   5. Cardiac risk counseling     Hypercholesterolemia: -tolerating rosuvastatin, last LDL 88  History of sarcoid:  No recent flares  History of grade 1 diastolic dysfunction: no clinical symptoms of volume overload. Euvolemic on exam today  History of soft systolic murmur: very soft, asymptomatic  History of elevated blood pressure reading:  -not yet at level that needs treatment, but continue to monitor  CV risk counseling  and prevention: -recommend heart healthy/Mediterranean diet, with whole grains, fruits, vegetable, fish, lean meats, nuts, and olive oil. Limit salt. -recommend moderate walking, 3-5 times/week for 30-50 minutes each session. Aim for at least 150 minutes/week. Goal should be pace of 3 miles/hours, or walking 1.5 miles in 30 minutes -recommend avoidance of tobacco products. Avoid excess alcohol. -she does not meet recommendations for aspirin for prevention, but she wishes to continue taking.  Follow up: 1 year or sooner as needed  Medication Adjustments/Labs and Tests Ordered: Current medicines are reviewed at length with the patient today.  Concerns regarding medicines are outlined above.   Tests Ordered: Orders Placed This Encounter  Procedures   EKG 12-Lead   Medication Changes: No orders of the defined types were placed in this encounter.  Patient Instructions  Medication Instructions:  Your physician recommends that you continue on your current medications as directed. Please refer to the Current Medication list given to you today.  *If you need a refill on your cardiac medications before your next appointment, please call your pharmacy*  Lab Work: NONE  Testing/Procedures: NONE  Follow-Up: At [facility], you and your health needs are our priority.  As part of our continuing mission to provide you with exceptional heart care, we have created designated Provider Care Teams.  These Care Teams include your primary Cardiologist (physician) and Advanced Practice Providers (APPs -  Physician Assistants and Nurse Practitioners) who all work together to provide you with the care you need, when you need it.  We recommend signing up for the patient portal called "MyChart".  Sign up information is provided on this After Visit Summary.  MyChart is used to connect with patients for Virtual Visits (Telemedicine).  Patients are able to view lab/test results, encounter notes, upcoming appointments, etc.  Non-urgent messages can be sent to your provider as well.   To learn more about what you can do with MyChart, go to [email or web address].    Your next appointment:   12 month(s)  The format for your next appointment:   In Person  Provider:   Jodelle Red, MD      Signed, Jodelle Red, MD  03/21/2023 5:26 PM    [facility]

## 2023-04-17 ENCOUNTER — Ambulatory Visit (HOSPITAL_BASED_OUTPATIENT_CLINIC_OR_DEPARTMENT_OTHER): Payer: Medicare Other | Admitting: Cardiology

## 2023-05-06 DIAGNOSIS — R55 Syncope and collapse: Secondary | ICD-10-CM | POA: Diagnosis not present

## 2023-05-06 DIAGNOSIS — I10 Essential (primary) hypertension: Secondary | ICD-10-CM | POA: Diagnosis not present

## 2023-05-06 DIAGNOSIS — K219 Gastro-esophageal reflux disease without esophagitis: Secondary | ICD-10-CM | POA: Diagnosis not present

## 2023-05-06 DIAGNOSIS — M47812 Spondylosis without myelopathy or radiculopathy, cervical region: Secondary | ICD-10-CM | POA: Diagnosis not present

## 2023-05-29 DIAGNOSIS — M25511 Pain in right shoulder: Secondary | ICD-10-CM | POA: Diagnosis not present

## 2023-05-29 DIAGNOSIS — M5416 Radiculopathy, lumbar region: Secondary | ICD-10-CM | POA: Diagnosis not present

## 2023-05-29 DIAGNOSIS — M48061 Spinal stenosis, lumbar region without neurogenic claudication: Secondary | ICD-10-CM | POA: Diagnosis not present

## 2023-06-23 DIAGNOSIS — M5416 Radiculopathy, lumbar region: Secondary | ICD-10-CM | POA: Diagnosis not present

## 2023-07-10 DIAGNOSIS — J310 Chronic rhinitis: Secondary | ICD-10-CM | POA: Diagnosis not present

## 2023-07-10 DIAGNOSIS — H2513 Age-related nuclear cataract, bilateral: Secondary | ICD-10-CM | POA: Diagnosis not present

## 2023-07-10 DIAGNOSIS — R55 Syncope and collapse: Secondary | ICD-10-CM | POA: Diagnosis not present

## 2023-07-10 DIAGNOSIS — H25013 Cortical age-related cataract, bilateral: Secondary | ICD-10-CM | POA: Diagnosis not present

## 2023-07-10 DIAGNOSIS — H52203 Unspecified astigmatism, bilateral: Secondary | ICD-10-CM | POA: Diagnosis not present

## 2023-07-10 DIAGNOSIS — G4452 New daily persistent headache (NDPH): Secondary | ICD-10-CM | POA: Diagnosis not present

## 2023-08-06 DIAGNOSIS — M792 Neuralgia and neuritis, unspecified: Secondary | ICD-10-CM | POA: Diagnosis not present

## 2023-08-06 DIAGNOSIS — B353 Tinea pedis: Secondary | ICD-10-CM | POA: Diagnosis not present

## 2023-08-06 DIAGNOSIS — I739 Peripheral vascular disease, unspecified: Secondary | ICD-10-CM | POA: Diagnosis not present

## 2023-09-01 DIAGNOSIS — M48061 Spinal stenosis, lumbar region without neurogenic claudication: Secondary | ICD-10-CM | POA: Diagnosis not present

## 2023-09-15 DIAGNOSIS — M5416 Radiculopathy, lumbar region: Secondary | ICD-10-CM | POA: Diagnosis not present

## 2023-09-22 ENCOUNTER — Other Ambulatory Visit: Payer: Self-pay | Admitting: Obstetrics and Gynecology

## 2023-09-22 DIAGNOSIS — Z1231 Encounter for screening mammogram for malignant neoplasm of breast: Secondary | ICD-10-CM

## 2023-10-21 ENCOUNTER — Ambulatory Visit
Admission: RE | Admit: 2023-10-21 | Discharge: 2023-10-21 | Disposition: A | Discharge status: Home / Self Care | Payer: Medicare Other | Source: Ambulatory Visit | Attending: Obstetrics and Gynecology | Admitting: Obstetrics and Gynecology

## 2023-10-21 DIAGNOSIS — S92354A Nondisplaced fracture of fifth metatarsal bone, right foot, initial encounter for closed fracture: Secondary | ICD-10-CM | POA: Diagnosis not present

## 2023-10-21 DIAGNOSIS — Z1231 Encounter for screening mammogram for malignant neoplasm of breast: Secondary | ICD-10-CM

## 2023-12-15 DIAGNOSIS — M25511 Pain in right shoulder: Secondary | ICD-10-CM | POA: Diagnosis not present

## 2023-12-15 DIAGNOSIS — M48061 Spinal stenosis, lumbar region without neurogenic claudication: Secondary | ICD-10-CM | POA: Diagnosis not present

## 2023-12-31 DIAGNOSIS — M5416 Radiculopathy, lumbar region: Secondary | ICD-10-CM | POA: Diagnosis not present

## 2024-01-21 DIAGNOSIS — J209 Acute bronchitis, unspecified: Secondary | ICD-10-CM | POA: Diagnosis not present

## 2024-01-21 DIAGNOSIS — M47812 Spondylosis without myelopathy or radiculopathy, cervical region: Secondary | ICD-10-CM | POA: Diagnosis not present

## 2024-01-21 DIAGNOSIS — I10 Essential (primary) hypertension: Secondary | ICD-10-CM | POA: Diagnosis not present

## 2024-03-17 DIAGNOSIS — M48061 Spinal stenosis, lumbar region without neurogenic claudication: Secondary | ICD-10-CM | POA: Diagnosis not present

## 2024-03-17 DIAGNOSIS — M25511 Pain in right shoulder: Secondary | ICD-10-CM | POA: Diagnosis not present

## 2024-03-17 DIAGNOSIS — M5416 Radiculopathy, lumbar region: Secondary | ICD-10-CM | POA: Diagnosis not present

## 2024-03-31 DIAGNOSIS — M5416 Radiculopathy, lumbar region: Secondary | ICD-10-CM | POA: Diagnosis not present

## 2024-04-16 ENCOUNTER — Encounter (HOSPITAL_BASED_OUTPATIENT_CLINIC_OR_DEPARTMENT_OTHER): Payer: Self-pay | Admitting: Cardiology

## 2024-04-16 ENCOUNTER — Ambulatory Visit (HOSPITAL_BASED_OUTPATIENT_CLINIC_OR_DEPARTMENT_OTHER): Payer: Medicare Other | Admitting: Cardiology

## 2024-04-16 VITALS — BP 128/70 | HR 66 | Ht 61.0 in | Wt 138.5 lb

## 2024-04-16 DIAGNOSIS — E7800 Pure hypercholesterolemia, unspecified: Secondary | ICD-10-CM | POA: Diagnosis not present

## 2024-04-16 DIAGNOSIS — Z7189 Other specified counseling: Secondary | ICD-10-CM

## 2024-04-16 DIAGNOSIS — I5189 Other ill-defined heart diseases: Secondary | ICD-10-CM

## 2024-04-16 DIAGNOSIS — R011 Cardiac murmur, unspecified: Secondary | ICD-10-CM | POA: Diagnosis not present

## 2024-04-16 DIAGNOSIS — D869 Sarcoidosis, unspecified: Secondary | ICD-10-CM | POA: Diagnosis not present

## 2024-04-16 NOTE — Patient Instructions (Signed)
 Medication Instructions:  Your physician recommends that you continue on your current medications as directed. Please refer to the Current Medication list given to you today.   Follow-Up: Please follow up as needed with Dr. Veryl Gottron, Slater Duncan, NP or Neomi Banks, NP

## 2024-04-16 NOTE — Progress Notes (Signed)
  Cardiology Office Note:  .   Date:  04/16/2024  ID:  Jeanne Chang, DOB 12/13/1950, MRN [id number] PCP: Kathyleen Parkins, MD  [facility] Providers Cardiologist:  Sheryle Donning, MD {  History of Present Illness: Jeanne Chang is a 73 y.o. female with PMHx sarcoid, grade 1 diastolic dysfunction, soft systolic murmur.   Today: Doing well overall. No limitations, walks daily, goes to the gym. No significant symptoms.   ROS: Denies chest pain, shortness of breath at rest or with normal exertion. No PND, orthopnea, LE edema or unexpected weight gain. No syncope or palpitations. ROS otherwise negative except as noted.   Studies Reviewed: Aaron Aas    EKG:  EKG Interpretation Date/Time:  Friday Apr 16 2024 14:24:43 EDT Ventricular Rate:  66 PR Interval:  124 QRS Duration:  68 QT Interval:  406 QTC Calculation: 425 R Axis:   10  Text Interpretation: Normal sinus rhythm Normal ECG Confirmed by Sheryle Donning [phone]) on 04/16/2024 2:50:15 PM    Physical Exam:   VS:  BP 128/70 (BP Location: Left Arm, Patient Position: Sitting)   Pulse 66   Ht 5\' 1"  (1.549 m)   Wt 138 lb 8 oz (62.8 kg)   SpO2 97%   BMI 26.17 kg/m    Wt Readings from Last 3 Encounters:  04/16/24 138 lb 8 oz (62.8 kg)  03/21/23 133 lb 8 oz (60.6 kg)  03/19/22 140 lb (63.5 kg)    GEN: Well nourished, well developed in no acute distress HEENT: Normal, moist mucous membranes NECK: No JVD CARDIAC: regular rhythm, normal S1 and S2, no rubs or gallops. 1/6 systolic murmur. VASCULAR: Radial and DP pulses 2+ bilaterally. No carotid bruits RESPIRATORY:  Clear to auscultation without rales, wheezing or rhonchi  ABDOMEN: Soft, non-tender, non-distended MUSCULOSKELETAL:  Ambulates independently SKIN: Warm and dry, no edema NEUROLOGIC:  Alert and oriented x 3. No focal neuro deficits noted. PSYCHIATRIC:  Normal affect    ASSESSMENT AND PLAN: .    Hypercholesterolemia: -tolerating rosuvastatin ,  last LDL 88   History of sarcoid:  No recent flares, hasn't had a problem in years   History of grade 1 diastolic dysfunction: no clinical symptoms of volume overload. Euvolemic on exam today   History of soft systolic murmur: very soft, asymptomatic  CV risk counseling and prevention -recommend heart healthy/Mediterranean diet, with whole grains, fruits, vegetable, fish, lean meats, nuts, and olive oil. Limit salt. -recommend moderate walking, 3-5 times/week for 30-50 minutes each session. Aim for at least 150 minutes.week. Goal should be pace of 3 miles/hours, or walking 1.5 miles in 30 minutes -recommend avoidance of tobacco products. Avoid excess alcohol.  Dispo: I would be happy to see her back as needed  Signed, Sheryle Donning, MD   Sheryle Donning, MD, PhD, [facility]  [facility]  [place]  [facility] at [facility] at [facility] [address] [place], [place] [address] [phone]

## 2024-05-05 DIAGNOSIS — M816 Localized osteoporosis [Lequesne]: Secondary | ICD-10-CM | POA: Diagnosis not present

## 2024-05-10 DIAGNOSIS — S52502A Unspecified fracture of the lower end of left radius, initial encounter for closed fracture: Secondary | ICD-10-CM | POA: Diagnosis not present

## 2024-05-10 DIAGNOSIS — M25562 Pain in left knee: Secondary | ICD-10-CM | POA: Diagnosis not present

## 2024-05-13 DIAGNOSIS — M25511 Pain in right shoulder: Secondary | ICD-10-CM | POA: Diagnosis not present

## 2024-05-13 DIAGNOSIS — M48061 Spinal stenosis, lumbar region without neurogenic claudication: Secondary | ICD-10-CM | POA: Diagnosis not present

## 2024-05-14 DIAGNOSIS — M25522 Pain in left elbow: Secondary | ICD-10-CM | POA: Diagnosis not present

## 2024-05-14 DIAGNOSIS — S52502A Unspecified fracture of the lower end of left radius, initial encounter for closed fracture: Secondary | ICD-10-CM | POA: Diagnosis not present

## 2024-05-28 DIAGNOSIS — S52502A Unspecified fracture of the lower end of left radius, initial encounter for closed fracture: Secondary | ICD-10-CM | POA: Diagnosis not present

## 2024-05-28 DIAGNOSIS — M25522 Pain in left elbow: Secondary | ICD-10-CM | POA: Diagnosis not present

## 2024-06-21 DIAGNOSIS — S52502A Unspecified fracture of the lower end of left radius, initial encounter for closed fracture: Secondary | ICD-10-CM | POA: Diagnosis not present

## 2024-07-09 DIAGNOSIS — M48061 Spinal stenosis, lumbar region without neurogenic claudication: Secondary | ICD-10-CM | POA: Diagnosis not present

## 2024-07-09 DIAGNOSIS — M25511 Pain in right shoulder: Secondary | ICD-10-CM | POA: Diagnosis not present

## 2024-07-27 DIAGNOSIS — S52502D Unspecified fracture of the lower end of left radius, subsequent encounter for closed fracture with routine healing: Secondary | ICD-10-CM | POA: Diagnosis not present

## 2024-08-02 DIAGNOSIS — M5416 Radiculopathy, lumbar region: Secondary | ICD-10-CM | POA: Diagnosis not present

## 2024-08-26 DIAGNOSIS — T7840XA Allergy, unspecified, initial encounter: Secondary | ICD-10-CM | POA: Diagnosis not present

## 2024-08-26 DIAGNOSIS — Z23 Encounter for immunization: Secondary | ICD-10-CM | POA: Diagnosis not present

## 2024-08-26 DIAGNOSIS — I5189 Other ill-defined heart diseases: Secondary | ICD-10-CM | POA: Diagnosis not present

## 2024-08-26 DIAGNOSIS — D869 Sarcoidosis, unspecified: Secondary | ICD-10-CM | POA: Diagnosis not present

## 2024-08-26 DIAGNOSIS — E7800 Pure hypercholesterolemia, unspecified: Secondary | ICD-10-CM | POA: Diagnosis not present

## 2024-09-20 ENCOUNTER — Other Ambulatory Visit: Payer: Self-pay | Admitting: Obstetrics and Gynecology

## 2024-09-20 DIAGNOSIS — Z1231 Encounter for screening mammogram for malignant neoplasm of breast: Secondary | ICD-10-CM

## 2024-10-21 ENCOUNTER — Ambulatory Visit
Admission: RE | Admit: 2024-10-21 | Discharge: 2024-10-21 | Disposition: A | Discharge status: Home / Self Care | Source: Ambulatory Visit | Attending: Obstetrics and Gynecology | Admitting: Obstetrics and Gynecology

## 2024-10-21 DIAGNOSIS — Z1231 Encounter for screening mammogram for malignant neoplasm of breast: Secondary | ICD-10-CM
